# Patient Record
Sex: Male | Born: 1960 | Race: White | Hispanic: No | Smoking: Former smoker
Health system: Southern US, Community
[De-identification: ages and names within clinical notes are randomized; demographics above are authoritative.]

## PROBLEM LIST (undated history)

## (undated) DIAGNOSIS — M1990 Unspecified osteoarthritis, unspecified site: Secondary | ICD-10-CM

## (undated) DIAGNOSIS — K219 Gastro-esophageal reflux disease without esophagitis: Secondary | ICD-10-CM

## (undated) DIAGNOSIS — F1910 Other psychoactive substance abuse, uncomplicated: Secondary | ICD-10-CM

## (undated) HISTORY — DX: Other psychoactive substance abuse, uncomplicated: F19.10

## (undated) HISTORY — PX: HERNIA REPAIR: SHX51

## (undated) HISTORY — DX: Gastro-esophageal reflux disease without esophagitis: K21.9

## (undated) HISTORY — PX: SPINE SURGERY: SHX786

## (undated) HISTORY — DX: Unspecified osteoarthritis, unspecified site: M19.90

## (undated) HISTORY — PX: FRACTURE SURGERY: SHX138

---

## 2021-08-26 ENCOUNTER — Other Ambulatory Visit: Payer: Self-pay

## 2021-08-26 ENCOUNTER — Ambulatory Visit: Payer: BC Managed Care – PPO | Admitting: Family

## 2021-08-26 ENCOUNTER — Encounter: Payer: Self-pay | Admitting: Family

## 2021-08-26 VITALS — BP 136/82 | HR 72 | Temp 97.8°F | Ht 70.0 in | Wt 185.6 lb

## 2021-08-26 DIAGNOSIS — R202 Paresthesia of skin: Secondary | ICD-10-CM

## 2021-08-26 DIAGNOSIS — D1779 Benign lipomatous neoplasm of other sites: Secondary | ICD-10-CM

## 2021-08-26 DIAGNOSIS — Z8042 Family history of malignant neoplasm of prostate: Secondary | ICD-10-CM

## 2021-08-26 DIAGNOSIS — Z23 Encounter for immunization: Secondary | ICD-10-CM | POA: Diagnosis not present

## 2021-08-26 DIAGNOSIS — Z0000 Encounter for general adult medical examination without abnormal findings: Secondary | ICD-10-CM | POA: Diagnosis not present

## 2021-08-26 DIAGNOSIS — L918 Other hypertrophic disorders of the skin: Secondary | ICD-10-CM

## 2021-08-26 DIAGNOSIS — N529 Male erectile dysfunction, unspecified: Secondary | ICD-10-CM

## 2021-08-26 DIAGNOSIS — R200 Anesthesia of skin: Secondary | ICD-10-CM

## 2021-08-26 NOTE — Patient Instructions (Addendum)
Welcome to [facility]! It was a pleasure meeting you today.  Go to the lab for blood work today. As discussed, I have sent your referrals for Orthopedics, Gastroenterology (Colonoscopy) and Dermatology, they will call you directly.  Please schedule a follow up visit today to discuss your ADHD.    PLEASE NOTE:  If you had any LAB tests please let us know if you have not heard back within a few days. You may see your results on MyChart before we have a chance to review them but we will give you a call once they are reviewed by [place]. If we ordered any REFERRALS today, please let us know if you have not heard from their office within the next week.  Let us know through MyChart if you are needing REFILLS, or have your pharmacy send [place] the request. You can also use MyChart to communicate with me or any office staff.  Please try these tips to maintain a healthy lifestyle:  Eat most of your calories during the day when you are active. Eliminate processed foods including packaged sweets (pies, cakes, cookies), reduce intake of potatoes, white bread, white pasta, and white rice. Look for whole grain options, oat flour or almond flour.  Each meal should contain half fruits/vegetables, one quarter protein, and one quarter carbs (no bigger than a computer mouse).  Cut down on sweet beverages. This includes juice, soda, and sweet tea. Also watch fruit intake, though this is a healthier sweet option, it still contains natural sugar! Limit to 3 servings daily.  Drink at least 1 glass of water with each meal and aim for at least 8 glasses per day  Exercise at least 150 minutes every week.

## 2021-08-26 NOTE — Progress Notes (Signed)
Phone: [phone]   Subjective:  Patient 61 y.o. male presenting for annual physical.  Chief Complaint  Patient presents with   Annual Exam    W/labs-ate a piece of fruit around lunchtime.    Skin Tag    Pt noticed places on his back that has increased in size in the last year. He also states that they are itchy.    Tumor    He states that he has an enlarged "fatty" tumor he would like to discuss.    HPI: Erectile Dysfunction: Patient complains of erectile dysfunction.  Onset of dysfunction was  months ago and was gradual in onset.  Patient states the nature of difficulty is maintaining erection. Full erections occur never. Partial erections occur with intercourse. Libido is not affected. 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 Patient denies history of cardiovascular disease, diabetes mellitus, neurologic disease, and urologic disease. Patient's expectations as to sexual function are to have a full erection during intercourse.  Patient's description of relationship w/partner ok.  Previous treatment of ED includes none.  Neuropathy: He describes symptoms of numbness and tingling. Onset of symptoms was  years ago . Symptoms are currently of localized severity. Symptoms occur intermittently and last minutes-hour. The patient denies burning and lancinating pain. Symptoms are worse on the right side. He also describes autonomic symptoms of  none.  Previous treatment has included Gabapentin, which did not have an impact on symptoms.   See problem oriented charting- ROS- full  review of systems was completed and negative  except for: right arm neuropathy and ED noted in HPI above.  The following were reviewed and entered/updated in epic: Past Medical History:  Diagnosis Date   Arthritis    GERD (gastroesophageal reflux disease)    Substance abuse ([place]) 1981   Patient Active Problem List   Diagnosis Date Noted   Lipoma 08/29/2021   History of cervical spinal surgery 08/29/2021    Numbness and tingling in right hand 08/29/2021   Erectile dysfunction 08/26/2021   Past Surgical History:  Procedure Laterality Date   FRACTURE SURGERY  02/1981   4th metacarpal Left hand   [facility]?   Rt side   SPINE SURGERY  09/2001   Neck surgery C4/C5    Family History  Problem Relation Age of Onset   Depression Mother    Diabetes Mother    ADD / ADHD Son    Depression Sister    Depression Sister     Medications- reviewed and updated Current Outpatient Medications  Medication Sig Dispense Refill   acetaminophen (TYLENOL) 500 MG tablet Take 500 mg by mouth every 6 (six) hours as needed.     famotidine (PEPCID) 20 MG tablet 20 mg  prn as needed     ibuprofen (ADVIL) 200 MG tablet 200 mg  prn as needed     No current facility-administered medications for this visit.    Allergies-reviewed and updated No Known Allergies  Social History   Social History Narrative   Not on file   Objective  Objective:  BP 136/82    Pulse 72    Temp 97.8 F (36.6 C) (Temporal)    Ht 5\' 10"  (1.778 m) Comment: with shoes   Wt 185 lb 9.6 oz (84.2 kg)    SpO2 98%    BMI 26.63 kg/m  Gen: NAD, resting comfortably HEENT: Mucous membranes are moist. Oropharynx normal Neck: no thyromegaly CV: RRR no murmurs rubs or gallops Lungs: CTAB no crackles, wheeze,  rhonchi Abdomen: soft/nontender/nondistended/normal bowel sounds. No rebound or guarding.  Ext: no edema Skin: warm, dry Neuro: grossly normal, moves all extremities, PERRLA    Assessment and Plan   Health Maintenance counseling: 1. Anticipatory guidance: Patient counseled regarding regular dental exams q6 months, eye exams yearly, avoiding smoking and second hand smoke, limiting alcohol to 2 beverages per day.   2. Risk factor reduction:  Advised patient of need for regular exercise and diet rich in fruits and vegetables to reduce risk of heart attack and stroke.    Wt Readings from Last 3 Encounters:  08/26/21 185  lb 9.6 oz (84.2 kg)   3. Immunizations/screenings/ancillary studies Immunization History  Administered Date(s) Administered   Influenza,inj,Quad PF,6+ Mos 08/26/2021   Health Maintenance Due  Topic Date Due   HIV Screening  Never done   Hepatitis C Screening  Never done   Zoster Vaccines- Shingrix (1 of 2) Never done   COLONOSCOPY (Pts 45-31yrs Insurance coverage will need to be confirmed)  Never done    4. Prostate cancer screening >55yo - risk factors?  Lab Results  Component Value Date   PSA 2.59 08/26/2021    5. Colon cancer screening:  yes, not due 6. Skin cancer screening- advised regular sunscreen use. Denies worrisome, changing, or new skin lesions.  7. Smoking associated screening (lung cancer screening, AAA screen 65-75, UA)- non- smoker 8. STD screening - N/A  Problem List Items Addressed This Visit       Other   Erectile dysfunction    New - checking testosterone level today      Relevant Orders   Testosterone, Free, Total, SHBG   Lipoma    middle of chest, skin colored, non-tender, reassured pt benign and will continue to monitor.      Numbness and tingling in right hand    HX of cervical sgy C4-C5 - then developed herniated C6 but told sgy not recommended. Used to take Gabapentin but states he is not currently.       Relevant Orders   Ambulatory referral to Orthopedic Surgery   Other Visit Diagnoses     Annual physical exam    -  Primary   Relevant Orders   Ambulatory referral to Gastroenterology   CBC with Differential/Platelet (Completed)   Comprehensive metabolic panel (Completed)   Lipid panel (Completed)   TSH (Completed)   Need for immunization against influenza       Relevant Orders   Flu Vaccine QUAD 27mo+IM (Fluarix, Fluzone & Alfiuria Quad PF) (Completed)   Multiple acquired skin tags       Relevant Orders   Ambulatory referral to Dermatology   Family history of prostate cancer in father       Relevant Orders   PSA (Completed)        Recommended follow up: Return in about 1 week (around 09/02/2021) for f/u appt for ADHD. Future Appointments  Date Time Provider [place]  09/02/2021  1:30 PM Jeanie Sewer, NP [facility]     Lab/Order associations: non- fasting   ICD-10-CM   1. Annual physical exam  Z00.00 Ambulatory referral to Gastroenterology    CBC with Differential/Platelet    Comprehensive metabolic panel    Lipid panel    TSH    CANCELED: Comprehensive metabolic panel    CANCELED: TSH    CANCELED: Lipid panel    CANCELED: CBC with Differential/Platelet    2. Need for immunization against influenza  Z23 Flu Vaccine QUAD 62mo+IM (Fluarix, Fluzone &  Alfiuria Quad PF)    3. Lipoma of other specified sites  D17.79     4. Multiple acquired skin tags  L91.8 Ambulatory referral to Dermatology    5. Numbness and tingling in right hand  R20.0 Ambulatory referral to Orthopedic Surgery   R20.2     6. Family history of prostate cancer in father  Z9.42 PSA    CANCELED: PSA    7. Erectile dysfunction, unspecified erectile dysfunction type  N52.9 Testosterone, Free, Total, SHBG    CANCELED: Testosterone, Free, Total, SHBG        Gaynor Ferreras, [place], NP

## 2021-08-27 LAB — CBC WITH DIFFERENTIAL/PLATELET
Absolute Monocytes: 608 cells/uL (ref 200–950)
Basophils Absolute: 53 cells/uL (ref 0–200)
Basophils Relative: 0.7 %
Eosinophils Absolute: 84 cells/uL (ref 15–500)
Eosinophils Relative: 1.1 %
HCT: 48 % (ref 38.5–50.0)
Hemoglobin: 16.5 g/dL (ref 13.2–17.1)
Lymphs Abs: 1588 cells/uL (ref 850–3900)
MCH: 30.5 pg (ref 27.0–33.0)
MCHC: 34.4 g/dL (ref 32.0–36.0)
MCV: 88.7 fL (ref 80.0–100.0)
MPV: 11.5 fL (ref 7.5–12.5)
Monocytes Relative: 8 %
Neutro Abs: 5267 cells/uL (ref 1500–7800)
Neutrophils Relative %: 69.3 %
Platelets: 187 10*3/uL (ref 140–400)
RBC: 5.41 10*6/uL (ref 4.20–5.80)
RDW: 12.2 % (ref 11.0–15.0)
Total Lymphocyte: 20.9 %
WBC: 7.6 10*3/uL (ref 3.8–10.8)

## 2021-08-27 LAB — COMPREHENSIVE METABOLIC PANEL
AG Ratio: 1.9 (calc) (ref 1.0–2.5)
ALT: 21 U/L (ref 9–46)
AST: 19 U/L (ref 10–35)
Albumin: 4.6 g/dL (ref 3.6–5.1)
Alkaline phosphatase (APISO): 53 U/L (ref 35–144)
BUN: 16 mg/dL (ref 7–25)
CO2: 22 mmol/L (ref 20–32)
Calcium: 9.6 mg/dL (ref 8.6–10.3)
Chloride: 106 mmol/L (ref 98–110)
Creat: 0.85 mg/dL (ref 0.70–1.35)
Globulin: 2.4 g/dL (calc) (ref 1.9–3.7)
Glucose, Bld: 96 mg/dL (ref 65–99)
Potassium: 4.2 mmol/L (ref 3.5–5.3)
Sodium: 141 mmol/L (ref 135–146)
Total Bilirubin: 0.7 mg/dL (ref 0.2–1.2)
Total Protein: 7 g/dL (ref 6.1–8.1)

## 2021-08-27 LAB — LIPID PANEL
Cholesterol: 163 mg/dL (ref <200)
HDL: 47 mg/dL (ref >=40)
LDL Cholesterol (Calc): 90 mg/dL (calc)
Non-HDL Cholesterol (Calc): 116 mg/dL (calc) (ref <130)
Total CHOL/HDL Ratio: 3.5 (calc) (ref <5.0)
Triglycerides: 154 mg/dL — ABNORMAL HIGH (ref <150)

## 2021-08-27 LAB — TSH: TSH: 1.76 mIU/L (ref 0.40–4.50)

## 2021-08-27 LAB — PSA: PSA: 2.59 ng/mL (ref <=4.00)

## 2021-08-29 DIAGNOSIS — R200 Anesthesia of skin: Secondary | ICD-10-CM | POA: Insufficient documentation

## 2021-08-29 DIAGNOSIS — D179 Benign lipomatous neoplasm, unspecified: Secondary | ICD-10-CM | POA: Insufficient documentation

## 2021-08-29 DIAGNOSIS — Z9889 Other specified postprocedural states: Secondary | ICD-10-CM | POA: Insufficient documentation

## 2021-08-29 HISTORY — DX: Anesthesia of skin: R20.0

## 2021-08-29 LAB — SPECIMEN STATUS REPORT

## 2021-08-29 NOTE — Assessment & Plan Note (Signed)
middle of chest, skin colored, non-tender, reassured pt benign and will continue to monitor.

## 2021-08-29 NOTE — Assessment & Plan Note (Signed)
HX of cervical sgy C4-C5 - then developed herniated C6 but told sgy not recommended. Used to take Gabapentin but states he is not currently.

## 2021-08-29 NOTE — Assessment & Plan Note (Signed)
New - checking testosterone level today

## 2021-09-02 ENCOUNTER — Ambulatory Visit: Payer: BC Managed Care – PPO | Admitting: Family

## 2021-09-02 ENCOUNTER — Encounter: Payer: Self-pay | Admitting: Family

## 2021-09-02 ENCOUNTER — Other Ambulatory Visit: Payer: Self-pay

## 2021-09-02 VITALS — BP 123/76 | HR 55 | Temp 97.7°F | Ht 70.0 in | Wt 188.8 lb

## 2021-09-02 DIAGNOSIS — F5232 Male orgasmic disorder: Secondary | ICD-10-CM

## 2021-09-02 DIAGNOSIS — N529 Male erectile dysfunction, unspecified: Secondary | ICD-10-CM | POA: Diagnosis not present

## 2021-09-02 DIAGNOSIS — F900 Attention-deficit hyperactivity disorder, predominantly inattentive type: Secondary | ICD-10-CM | POA: Insufficient documentation

## 2021-09-02 MED ORDER — BUPROPION HCL ER (SR) 150 MG PO TB12: ORAL_TABLET | ORAL | 1 refills | Status: DC

## 2021-09-02 MED ORDER — BUPROPION HCL ER (SR) 150 MG PO TB12: ORAL_TABLET | ORAL | 2 refills | Status: DC

## 2021-09-02 NOTE — Assessment & Plan Note (Signed)
testosterone level wnl. pt reports that he can initiate an erection and maintain an erection sometimes, but will not orgasm. However, when he masturbates he can orgasm. Restarting Wellbutrin today for ADHD and he may see improvement in this regard as well. Discussed sex therapy as an option also. Advised to let me know if continuing concerns in a 2-3 weeks.

## 2021-09-02 NOTE — Assessment & Plan Note (Signed)
f/u today - testosterone level wnl. pt reports that he can initiate an erection and maintain an erection sometimes, but will not orgasm. However, when he masturbates he can orgasm. Restarting Wellbutrin today for ADHD and he may see improvement in this regard as well. Advised to let me know if continuing concerns in a 2-3 weeks.

## 2021-09-02 NOTE — Assessment & Plan Note (Signed)
Chronic - pt has had for long time, but due to no insurance and other concerns, has not taken his Wellbutrin in about 6 years, would like to restart this today. Advised on use and SE, will start 150mg  bid, f/u in 6 mos or let me know sooner if any concerns.

## 2021-09-02 NOTE — Patient Instructions (Addendum)
It was very nice to see you today!  Wellbutrin refill has been sent to your pharmacy. Start this daily in the morning for one week before increasing to twice a day to avoid side effects. Let me know if you have any or as discussed, it is not helping with your ED or lack of orgasm after taking for 2-3 weeks.    PLEASE NOTE:  If you had any lab tests please let us know if you have not heard back within a few days. You may see your results on MyChart before we have a chance to review them but we will give you a call once they are reviewed by [place]. If we ordered any referrals today, please let us know if you have not heard from their office within the next week.

## 2021-09-02 NOTE — Progress Notes (Signed)
Subjective:     Patient ID: Karl Davis, male    DOB: 1961-02-27, 61 y.o.   MRN: [id number]  Chief Complaint  Patient presents with   ADD    Pt is requesting Wellbutrin refill.   HPI: ADHD f/u: Medications helping target goals: Wellbutrin Regimen: bid Medication side effects/concerns: none Weight: stable Sleep: not affected Mood changes: none Tics: denies Blood pressure, Weight, Pulse, Behavior reviewed: wnl Erectile Dysfunction: Patient complains of erectile dysfunction.  Onset of dysfunction was months ago and was gradual in onset.  Patient states the nature of difficulty is sometimes maintaining erection. Full erections occur sometimes. Partial erections occur with intercourse. Pt also reports anorgasmy with intercourse but can have orgasm with masturbation. Libido is not affected. 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 Patient denies history of cardiovascular disease, diabetes mellitus, neurologic disease, and urologic disease. Patient's expectations as to sexual function are to have a full erection during intercourse.  Patient's description of relationship w/partner ok.  Previous treatment of ED includes none.      Health Maintenance Due  Topic Date Due   HIV Screening  Never done   Hepatitis C Screening  Never done   Zoster Vaccines- Shingrix (1 of 2) Never done   COLONOSCOPY (Pts 45-4yrs Insurance coverage will need to be confirmed)  Never done    Past Medical History:  Diagnosis Date   Arthritis    GERD (gastroesophageal reflux disease)    Substance abuse ([place]) 1981    Past Surgical History:  Procedure Laterality Date   FRACTURE SURGERY  02/1981   4th metacarpal Left hand   HERNIA REPAIR  1997? 1998?   Rt side   SPINE SURGERY  09/2001   Neck surgery C4/C5    Outpatient Medications Prior to Visit  Medication Sig Dispense Refill   acetaminophen (TYLENOL) 500 MG tablet Take 500 mg by mouth every 6 (six) hours as needed.     famotidine (PEPCID)  20 MG tablet 20 mg  prn as needed     ibuprofen (ADVIL) 200 MG tablet 200 mg  prn as needed     No facility-administered medications prior to visit.    No Known Allergies      Objective:    Physical Exam Vitals and nursing note reviewed.  Constitutional:      General: He is not in acute distress.    Appearance: Normal appearance.  HENT:     Head: Normocephalic.  Cardiovascular:     Rate and Rhythm: Normal rate and regular rhythm.  Pulmonary:     Effort: Pulmonary effort is normal.     Breath sounds: Normal breath sounds.  Musculoskeletal:        General: Normal range of motion.     Cervical back: Normal range of motion.  Skin:    General: Skin is warm and dry.  Neurological:     Mental Status: He is alert and oriented to person, place, and time.  Psychiatric:        Mood and Affect: Mood normal.    BP 123/76    Pulse (!) 55    Temp 97.7 F (36.5 C) (Temporal)    Ht 5\' 10"  (1.778 m)    Wt 188 lb 12.8 oz (85.6 kg)    SpO2 98%    BMI 27.09 kg/m  Wt Readings from Last 3 Encounters:  09/02/21 188 lb 12.8 oz (85.6 kg)  08/26/21 185 lb 9.6 oz (84.2 kg)       Assessment &  Plan:   Problem List Items Addressed This Visit       Other   Erectile dysfunction    f/u today - testosterone level wnl. pt reports that he can initiate an erection and maintain an erection sometimes, but will not orgasm. However, when he masturbates he can orgasm. Restarting Wellbutrin today for ADHD and he may see improvement in this regard as well. Advised to let me know if continuing concerns in a 2-3 weeks.      Attention deficit hyperactivity disorder (ADHD), predominantly inattentive type - Primary    Chronic - pt has had for long time, but due to no insurance and other concerns, has not taken his Wellbutrin in about 6 years, would like to restart this today. Advised on use and SE, will start 150mg  bid, f/u in 6 mos or let me know sooner if any concerns.      Relevant Medications    buPROPion (WELLBUTRIN SR) 150 MG 12 hr tablet   Anorgasmia of male    testosterone level wnl. pt reports that he can initiate an erection and maintain an erection sometimes, but will not orgasm. However, when he masturbates he can orgasm. Restarting Wellbutrin today for ADHD and he may see improvement in this regard as well. Discussed sex therapy as an option also. Advised to let me know if continuing concerns in a 2-3 weeks.       Meds ordered this encounter  Medications   DISCONTD: buPROPion (WELLBUTRIN SR) 150 MG 12 hr tablet    Sig: Take one tablet by mouth daily for 3 days, and then increase to one tablet by mouth twice daily.    Dispense:  60 tablet    Refill:  2    Order Specific Question:   Supervising Provider    Answer:   ANDY, CAMILLE L [[id number]]   buPROPion (WELLBUTRIN SR) 150 MG 12 hr tablet    Sig: Take one tablet by mouth daily for 3 days, and then increase to one tablet by mouth twice daily.    Dispense:  180 tablet    Refill:  1    Order Specific Question:   Supervising Provider    Answer:   ANDY, CAMILLE L [[id number]]

## 2021-09-06 ENCOUNTER — Ambulatory Visit: Payer: Self-pay

## 2021-09-06 ENCOUNTER — Encounter: Payer: Self-pay | Admitting: Orthopaedic Surgery

## 2021-09-06 ENCOUNTER — Other Ambulatory Visit: Payer: Self-pay

## 2021-09-06 ENCOUNTER — Ambulatory Visit: Payer: BC Managed Care – PPO | Admitting: Orthopaedic Surgery

## 2021-09-06 VITALS — BP 133/86 | HR 58 | Ht 70.0 in | Wt 185.0 lb

## 2021-09-06 DIAGNOSIS — R200 Anesthesia of skin: Secondary | ICD-10-CM

## 2021-09-06 DIAGNOSIS — M542 Cervicalgia: Secondary | ICD-10-CM | POA: Diagnosis not present

## 2021-09-06 LAB — TESTOSTERONE, FREE, TOTAL, SHBG
Sex Hormone Binding: 4.7 nmol/L — ABNORMAL LOW (ref 19.3–76.4)
Testosterone, Free: 10.1 pg/mL (ref 6.6–18.1)
Testosterone: 452 ng/dL (ref 264–916)

## 2021-09-06 NOTE — Progress Notes (Signed)
Office Visit Note   Patient: Karl Davis           Date of Birth: 1961-03-22           MRN: [id number] Visit Date: 09/06/2021              Requested by: Jeanie Sewer, NP [place],  [place] [address] PCP: Jeanie Sewer, NP   Assessment & Plan: Visit Diagnoses:  1. Neck pain   2. 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  Single level C5-6 fused by Dr. Huston Foley 20 years ago is solid.  Will obtain some nerve conduction velocities evaluating for carpal tunnel syndrome versus cervical radiculopathy office follow-up after tests for review.  Follow-Up Instructions: No follow-ups on file.   Orders:  Orders Placed This Encounter  Procedures   XR Cervical Spine 2 or 3 views   Ambulatory referral to Physical Medicine Rehab   No orders of the defined types were placed in this encounter.     Procedures: No procedures performed   Clinical Data: No additional findings.   Subjective: Chief Complaint  Patient presents with   Neck - Pain    HPI 61 year old male had previous cervical fusion 20 years ago by Dr. Jackolyn Confer at [facility] at C5-6 which is solid.  Patient previously gone to the pain clinic for injections with relief he has been on tramadol and gabapentin.  He has had numbness in his right hand and fingers some on the radial side occasionally ulnar.  He has noticed some right arm weakness but is able to do a push-up today in the office.  Occasional symptoms on the left but primarily right.  He is used ibuprofen and Tylenol with relief.  Review of Systems past history of ADHD previous cervical fusion.   Objective: Vital Signs: BP 133/86    Pulse (!) 58    Ht 5\' 10"  (1.778 m)    Wt 185 lb (83.9 kg)    BMI 26.54 kg/m   Physical Exam Constitutional:      Appearance: He is well-developed.  HENT:     Head:  Normocephalic and atraumatic.     Right Ear: External ear normal.     Left Ear: External ear normal.  Eyes:     Pupils: Pupils are equal, round, and reactive to light.  Neck:     Thyroid: No thyromegaly.     Trachea: No tracheal deviation.  Cardiovascular:     Rate and Rhythm: Normal rate.  Pulmonary:     Effort: Pulmonary effort is normal.     Breath sounds: No wheezing.  Abdominal:     General: Bowel sounds are normal.     Palpations: Abdomen is soft.  Musculoskeletal:     Cervical back: Neck supple.  Skin:    General: Skin is warm and dry.     Capillary Refill: Capillary refill takes less than 2 seconds.  Neurological:     Mental Status: He is alert and oriented to person, place, and time.  Psychiatric:        Behavior: Behavior normal.        Thought Content: Thought content normal.        Judgment: Judgment normal.    Ortho Exam reflexes are 2+ no thenar atrophy some discomfort with Phalen's test positive carpal compression test on the right some  brachial plexus tenderness.  He has 70% rotation of the right and 40% to the left with discomfort.  Specialty Comments:  No specialty comments available.  Imaging: XR Cervical Spine 2 or 3 views  Result Date: 09/06/2021 AP lateral cervical spine images are obtained and reviewed.  This shows previous C5-6 fusion with bridging spurs C2-C7 with partial consolidation.  Disc base remains open except for C5-6 that is solidly healed. Assessment: Hypertrophic spondylosis with prominent anterior osteophytes.  Solid C5-6 fusion.    PMFS History: Patient Active Problem List   Diagnosis Date Noted   Attention deficit hyperactivity disorder (ADHD), predominantly inattentive type 09/02/2021   Anorgasmia of male 09/02/2021   Lipoma 08/29/2021   History of cervical spinal surgery 08/29/2021   Numbness and tingling in right hand 08/29/2021   Erectile dysfunction 08/26/2021   Past Medical History:  Diagnosis Date   Arthritis    GERD  (gastroesophageal reflux disease)    Substance abuse ([place]) 1981    Family History  Problem Relation Age of Onset   Depression Mother    Diabetes Mother    ADD / ADHD Son    Depression Sister    Depression Sister     Past Surgical History:  Procedure Laterality Date   FRACTURE SURGERY  02/1981   4th metacarpal Left hand   HERNIA REPAIR  1997? 1998?   Rt side   SPINE SURGERY  09/2001   Neck surgery C4/C5   Social History   Occupational History   Not on file  Tobacco Use   Smoking status: Never    Passive exposure: Yes   Smokeless tobacco: Former  Substance and Sexual Activity   Alcohol use: Not Currently   Drug use: Not Currently    Types: Amphetamines, "Crack" cocaine, Marijuana, LSD   Sexual activity: Yes

## 2021-09-12 ENCOUNTER — Encounter: Payer: Self-pay | Admitting: Family

## 2021-09-13 NOTE — Telephone Encounter (Signed)
Please see message and advise. Any recommendations?  ?

## 2021-09-15 ENCOUNTER — Other Ambulatory Visit: Payer: Self-pay

## 2021-09-27 ENCOUNTER — Encounter: Payer: Self-pay | Admitting: Physical Medicine and Rehabilitation

## 2021-09-27 ENCOUNTER — Other Ambulatory Visit: Payer: Self-pay

## 2021-09-27 ENCOUNTER — Ambulatory Visit (INDEPENDENT_AMBULATORY_CARE_PROVIDER_SITE_OTHER): Payer: BC Managed Care – PPO | Admitting: Physical Medicine and Rehabilitation

## 2021-09-27 DIAGNOSIS — R202 Paresthesia of skin: Secondary | ICD-10-CM | POA: Diagnosis not present

## 2021-09-27 NOTE — Procedures (Signed)
EMG & NCV Findings: ?Evaluation of the right median motor nerve showed prolonged distal onset latency (4.5 ms) and decreased conduction velocity (Elbow-Wrist, 46 m/s).  The left median (across palm) sensory and the right median (across palm) sensory nerves showed prolonged distal peak latency (Wrist, L4.2, R4.5 ms).  All remaining nerves (as indicated in the following tables) were within normal limits.  All left vs. right side differences were within normal limits.   ? ?All examined muscles (as indicated in the following table) showed no evidence of electrical instability.   ? ?Impression: ?The above electrodiagnostic study is ABNORMAL and reveals evidence of: ? ?a moderate left median nerve entrapment at the wrist (carpal tunnel syndrome) affecting sensory and motor components. ?  ?a mild left median nerve entrapment at the wrist (carpal tunnel syndrome) affecting sensory components.  ? ?There is no significant electrodiagnostic evidence of any other focal nerve entrapment, brachial plexopathy or cervical radiculopathy. ** As you know, this particular electrodiagnostic study cannot rule out chemical radiculitis or sensory only radiculopathy. ? ?Recommendations: ?1.  Follow-up with referring physician. ?2.  Continue current management of symptoms. ?3.  Continue use of resting splint at night-time and as needed during the day. Surgical eval. ? ?___________________________ ?Laurence Spates FAAPMR ?Board Certified, Tax adviser of Physical Medicine and Rehabilitation ? ? ? ?Nerve Conduction Studies ?Anti Sensory Summary Table ? ? Stim Site NR Peak (ms) Norm Peak (ms) P-T Amp (?V) Norm P-T Amp Site1 Site2 Delta-P (ms) Dist (cm) Vel (m/s) Norm Vel (m/s)  ?Left Median Acr Palm Anti Sensory (2nd Digit)  30.9?C  ?Wrist    *4.2 <3.6 15.7 >10 Wrist Palm 2.4 0.0    ?Palm    1.8 <2.0 18.5         ?Right Median Acr Palm Anti Sensory (2nd Digit)  30.4?C  ?Wrist    *4.5 <3.6 20.5 >10 Wrist Palm 2.5 0.0    ?Palm    2.0 <2.0 17.0          ?Left Radial Anti Sensory (Base 1st Digit)  30.9?C  ?Wrist    2.2 <3.1 34.6  Wrist Base 1st Digit 2.2 0.0    ?Right Radial Anti Sensory (Base 1st Digit)  30.6?C  ?Wrist    2.3 <3.1 31.6  Wrist Base 1st Digit 2.3 0.0    ?Left Ulnar Anti Sensory (5th Digit)  31.1?C  ?Wrist    3.4 <3.7 29.2 >15.0 Wrist 5th Digit 3.4 14.0 41 >38  ?Right Ulnar Anti Sensory (5th Digit)  30.7?C  ?Wrist    3.3 <3.7 18.8 >15.0 Wrist 5th Digit 3.3 14.0 42 >38  ? ?Motor Summary Table ? ? Stim Site NR Onset (ms) Norm Onset (ms) O-P Amp (mV) Norm O-P Amp Site1 Site2 Delta-0 (ms) Dist (cm) Vel (m/s) Norm Vel (m/s)  ?Left Median Motor (Abd Poll Brev)  31?C  ?Wrist    3.9 <4.2 7.7 >5 Elbow Wrist 4.1 20.5 50 >50  ?Elbow    8.0  7.5         ?Right Median Motor (Abd Poll Brev)  30.9?C  ?Wrist    *4.5 <4.2 5.5 >5 Elbow Wrist 4.5 20.5 *46 >50  ?Elbow    9.0  5.9         ?Left Ulnar Motor (Abd Dig Min)  31.2?C  ?Wrist    3.2 <4.2 10.1 >3 B Elbow Wrist 3.4 21.0 62 >53  ?B Elbow    6.6  8.2  A Elbow B Elbow 1.6 10.0 63 >53  ?  A Elbow    8.2  7.9         ?Right Ulnar Motor (Abd Dig Min)  31.1?C  ?Wrist    3.0 <4.2 12.0 >3 B Elbow Wrist 3.6 21.0 58 >53  ?B Elbow    6.6  11.0  A Elbow B Elbow 1.5 10.5 70 >53  ?A Elbow    8.1  10.6         ? ?EMG ? ? Side Muscle Nerve Root Ins Act Fibs Psw Amp Dur Poly Recrt Int Fraser Din Comment  ?Right Abd Poll Brev Median C8-T1 Nml Nml Nml Nml Nml 0 Nml Nml   ?Right 1stDorInt Ulnar C8-T1 Nml Nml Nml Nml Nml 0 Nml Nml   ?Right PronatorTeres Median C6-7 Nml Nml Nml Nml Nml 0 Nml Nml   ?Right Biceps Musculocut C5-6 Nml Nml Nml Nml Nml 0 Nml Nml   ?Right Deltoid Axillary C5-6 Nml Nml Nml Nml Nml 0 Nml Nml   ? ? ?Nerve Conduction Studies ?Anti Sensory Left/Right Comparison ? ? Stim Site L Lat (ms) R Lat (ms) L-R Lat (ms) L Amp (?V) R Amp (?V) L-R Amp (%) Site1 Site2 L Vel (m/s) R Vel (m/s) L-R Vel (m/s)  ?Median Acr Palm Anti Sensory (2nd Digit)  30.9?C  ?Wrist *4.2 *4.5 0.3 15.7 20.5 23.4 Wrist Palm     ?Palm 1.8 2.0 0.2 18.5 17.0  8.1       ?Radial Anti Sensory (Base 1st Digit)  30.9?C  ?Wrist 2.2 2.3 0.1 34.6 31.6 8.7 Wrist Base 1st Digit     ?Ulnar Anti Sensory (5th Digit)  31.1?C  ?Wrist 3.4 3.3 0.1 29.2 18.8 35.6 Wrist 5th Digit 41 42 1  ? ?Motor Left/Right Comparison ? ? Stim Site L Lat (ms) R Lat (ms) L-R Lat (ms) L Amp (mV) R Amp (mV) L-R Amp (%) Site1 Site2 L Vel (m/s) R Vel (m/s) L-R Vel (m/s)  ?Median Motor (Abd Poll Brev)  31?C  ?Wrist 3.9 *4.5 0.6 7.7 5.5 28.6 Elbow Wrist 50 *46 4  ?Elbow 8.0 9.0 1.0 7.5 5.9 21.3       ?Ulnar Motor (Abd Dig Min)  31.2?C  ?Wrist 3.2 3.0 0.2 10.1 12.0 15.8 B Elbow Wrist 62 58 4  ?B Elbow 6.6 6.6 0.0 8.2 11.0 25.5 A Elbow B Elbow 63 70 7  ?A Elbow 8.2 8.1 0.1 7.9 10.6 25.5       ? ? ? ?Waveforms: ?    ? ?    ? ?    ? ?  ? ?

## 2021-09-27 NOTE — Progress Notes (Addendum)
? ?Karl Davis - 61 y.o. male MRN [id number]  Date of birth: 21-Feb-1961 ? ?Office Visit Note: ?Visit Date: 09/27/2021 ?PCP: Jeanie Sewer, NP ?Referred by: Marybelle Killings, MD ? ?Subjective: ?Chief Complaint  ?Patient presents with  ? Right Hand - Numbness  ? Left Hand - Numbness  ? ?HPI:  Karl Davis is a 61 y.o. male who comes in today at the request of Dr. Rodell Perna for electrodiagnostic study of the Bilateral upper extremities.  Patient is Right hand dominant.  He reports symptoms in both hands right more than left.  He endorses more ulnar-sided symptoms than radial but sometimes it can be the whole hand.  He has a history of prior C5-6 cervical fusion some 20 years ago.  He does have some neck pain and shoulder pain at times.  He has not had previous electrodiagnostic studies.  He denies any diabetes. ? ?ROS Otherwise per HPI. ? ?Assessment & Plan: ?Visit Diagnoses:  ?  ICD-10-CM   ?1. Paresthesia of skin  R20.2 NCV with EMG (electromyography)  ?  ?  ?Plan: Impression: ?The above electrodiagnostic study is ABNORMAL and reveals evidence of: ? ?a moderate RIGHT median nerve entrapment at the wrist (carpal tunnel syndrome) affecting sensory and motor components. ?  ?a mild left median nerve entrapment at the wrist (carpal tunnel syndrome) affecting sensory components.  ? ?There is no significant electrodiagnostic evidence of any other focal nerve entrapment, brachial plexopathy or cervical radiculopathy. ** As you know, this particular electrodiagnostic study cannot rule out chemical radiculitis or sensory only radiculopathy. ? ?Recommendations: ?1.  Follow-up with referring physician. ?2.  Continue current management of symptoms. ?3.  Continue use of resting splint at night-time and as needed during the day. Surgical eval. ? ?Meds & Orders: No orders of the defined types were placed in this encounter. ?  ?Orders Placed This Encounter  ?Procedures  ? NCV with EMG (electromyography)  ?   ?Follow-up: Return in about 2 weeks (around 10/11/2021) for Rodell Perna, MD.  ? 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 ?EMG & NCV Findings: ?Evaluation of the right median motor nerve showed prolonged distal onset latency (4.5 ms) and decreased conduction velocity (Elbow-Wrist, 46 m/s).  The left median (across palm) sensory and the right median (across palm) sensory nerves showed prolonged distal peak latency (Wrist, L4.2, R4.5 ms).  All remaining nerves (as indicated in the following tables) were within normal limits.  All left vs. right side differences were within normal limits.   ? ?All examined muscles (as indicated in the following table) showed no evidence of electrical instability.   ? ?Impression: ?The above electrodiagnostic study is ABNORMAL and reveals evidence of: ? ?a moderate left median nerve entrapment at the wrist (carpal tunnel syndrome) affecting sensory and motor components. ?  ?a mild left median nerve entrapment at the wrist (carpal tunnel syndrome) affecting sensory components.  ? ?There is no significant electrodiagnostic evidence of any other focal nerve entrapment, brachial plexopathy or cervical radiculopathy. ** As you know, this particular electrodiagnostic study cannot rule out chemical radiculitis or sensory only radiculopathy. ? ?Recommendations: ?1.  Follow-up with referring physician. ?2.  Continue current management of symptoms. ?3.  Continue use of resting splint at night-time and as needed during the day. Surgical eval. ? ?___________________________ ?Laurence Spates FAAPMR ?Board Certified, Tax adviser of Physical Medicine and Rehabilitation ? ? ? ?Nerve  Conduction Studies ?Anti Sensory Summary Table ? ? Stim Site NR Peak (ms) Norm Peak (ms) P-T Amp (?V) Norm P-T Amp Site1 Site2 Delta-P (ms) Dist (cm) Vel (m/s) Norm Vel (m/s)  ?Left Median Acr Palm Anti Sensory (2nd  Digit)  30.9?C  ?Wrist    *4.2 <3.6 15.7 >10 Wrist Palm 2.4 0.0    ?Palm    1.8 <2.0 18.5         ?Right Median Acr Palm Anti Sensory (2nd Digit)  30.4?C  ?Wrist    *4.5 <3.6 20.5 >10 Wrist Palm 2.5 0.0    ?Palm    2.0 <2.0 17.0         ?Left Radial Anti Sensory (Base 1st Digit)  30.9?C  ?Wrist    2.2 <3.1 34.6  Wrist Base 1st Digit 2.2 0.0    ?Right Radial Anti Sensory (Base 1st Digit)  30.6?C  ?Wrist    2.3 <3.1 31.6  Wrist Base 1st Digit 2.3 0.0    ?Left Ulnar Anti Sensory (5th Digit)  31.1?C  ?Wrist    3.4 <3.7 29.2 >15.0 Wrist 5th Digit 3.4 14.0 41 >38  ?Right Ulnar Anti Sensory (5th Digit)  30.7?C  ?Wrist    3.3 <3.7 18.8 >15.0 Wrist 5th Digit 3.3 14.0 42 >38  ? ?Motor Summary Table ? ? Stim Site NR Onset (ms) Norm Onset (ms) O-P Amp (mV) Norm O-P Amp Site1 Site2 Delta-0 (ms) Dist (cm) Vel (m/s) Norm Vel (m/s)  ?Left Median Motor (Abd Poll Brev)  31?C  ?Wrist    3.9 <4.2 7.7 >5 Elbow Wrist 4.1 20.5 50 >50  ?Elbow    8.0  7.5         ?Right Median Motor (Abd Poll Brev)  30.9?C  ?Wrist    *4.5 <4.2 5.5 >5 Elbow Wrist 4.5 20.5 *46 >50  ?Elbow    9.0  5.9         ?Left Ulnar Motor (Abd Dig Min)  31.2?C  ?Wrist    3.2 <4.2 10.1 >3 B Elbow Wrist 3.4 21.0 62 >53  ?B Elbow    6.6  8.2  A Elbow B Elbow 1.6 10.0 63 >53  ?A Elbow    8.2  7.9         ?Right Ulnar Motor (Abd Dig Min)  31.1?C  ?Wrist    3.0 <4.2 12.0 >3 B Elbow Wrist 3.6 21.0 58 >53  ?B Elbow    6.6  11.0  A Elbow B Elbow 1.5 10.5 70 >53  ?A Elbow    8.1  10.6         ? ?EMG ? ? Side Muscle Nerve Root Ins Act Fibs Psw Amp Dur Poly Recrt Int Fraser Din Comment  ?Right Abd Poll Brev Median C8-T1 Nml Nml Nml Nml Nml 0 Nml Nml   ?Right 1stDorInt Ulnar C8-T1 Nml Nml Nml Nml Nml 0 Nml Nml   ?Right PronatorTeres Median C6-7 Nml Nml Nml Nml Nml 0 Nml Nml   ?Right Biceps Musculocut C5-6 Nml Nml Nml Nml Nml 0 Nml Nml   ?Right Deltoid Axillary C5-6 Nml Nml Nml Nml Nml 0 Nml Nml   ? ? ?Nerve Conduction Studies ?Anti Sensory Left/Right Comparison ? ? Stim Site L Lat (ms) R Lat  (ms) L-R Lat (ms) L Amp (?V) R Amp (?V) L-R Amp (%) Site1 Site2 L Vel (m/s) R Vel (m/s) L-R Vel (m/s)  ?Median Acr Palm Anti Sensory (2nd Digit)  30.9?C  ?Wrist *4.2 *4.5 0.3 15.7  20.5 23.4 Wrist Palm     ?Palm 1.8 2.0 0.2 18.5 17.0 8.1       ?Radial Anti Sensory (Base 1st Digit)  30.9?C  ?Wrist 2.2 2.3 0.1 34.6 31.6 8.7 Wrist Base 1st Digit     ?Ulnar Anti Sensory (5th Digit)  31.1?C  ?Wrist 3.4 3.3 0.1 29.2 18.8 35.6 Wrist 5th Digit 41 42 1  ? ?Motor Left/Right Comparison ? ? Stim Site L Lat (ms) R Lat (ms) L-R Lat (ms) L Amp (mV) R Amp (mV) L-R Amp (%) Site1 Site2 L Vel (m/s) R Vel (m/s) L-R Vel (m/s)  ?Median Motor (Abd Poll Brev)  31?C  ?Wrist 3.9 *4.5 0.6 7.7 5.5 28.6 Elbow Wrist 50 *46 4  ?Elbow 8.0 9.0 1.0 7.5 5.9 21.3       ?Ulnar Motor (Abd Dig Min)  31.2?C  ?Wrist 3.2 3.0 0.2 10.1 12.0 15.8 B Elbow Wrist 62 58 4  ?B Elbow 6.6 6.6 0.0 8.2 11.0 25.5 A Elbow B Elbow 63 70 7  ?A Elbow 8.2 8.1 0.1 7.9 10.6 25.5       ? ? ? ?Waveforms: ?    ? ?    ? ?    ? ?  ?  ? ?Clinical History: ?No specialty comments available.  ? ? ? ?Objective:  VS:  HT:    WT:   BMI:     BP:   HR: bpm  TEMP: ( )  RESP:  ?Physical Exam ?Musculoskeletal:     ?   General: No tenderness.  ?   Comments: Inspection reveals no atrophy of the bilateral APB or FDI or hand intrinsics. There is no swelling, color changes, allodynia or dystrophic changes. There is 5 out of 5 strength in the bilateral wrist extension, finger abduction and long finger flexion. There is intact sensation to light touch in all dermatomal and peripheral nerve distributions. Bretta Bang is a negative Hoffmann's test bilaterally.  ?Skin: ?   General: Skin is warm and dry.  ?   Findings: No erythema or rash.  ?Neurological:  ?   General: No focal deficit present.  ?   Mental Status: He is alert and oriented to person, place, and time.  ?   Sensory: No sensory deficit.  ?   Motor: No weakness or abnormal muscle tone.  ?   Coordination: Coordination normal.  ?   Gait: Gait  normal.  ?Psychiatric:     ?   Mood and Affect: Mood normal.     ?   Behavior: Behavior normal.     ?   Thought Content: Thought content normal.  ?  ? ?Imaging: ?No results found. ?

## 2021-10-05 ENCOUNTER — Ambulatory Visit: Payer: BC Managed Care – PPO | Admitting: Orthopaedic Surgery

## 2021-10-05 ENCOUNTER — Encounter: Payer: Self-pay | Admitting: Physician Assistant

## 2021-10-05 ENCOUNTER — Encounter: Payer: Self-pay | Admitting: Orthopaedic Surgery

## 2021-10-05 ENCOUNTER — Other Ambulatory Visit: Payer: Self-pay

## 2021-10-05 VITALS — BP 127/77 | HR 75 | Ht 70.0 in | Wt 185.0 lb

## 2021-10-05 DIAGNOSIS — M542 Cervicalgia: Secondary | ICD-10-CM | POA: Diagnosis not present

## 2021-10-05 DIAGNOSIS — M4722 Other spondylosis with radiculopathy, cervical region: Secondary | ICD-10-CM

## 2021-10-05 DIAGNOSIS — G5603 Carpal tunnel syndrome, bilateral upper limbs: Secondary | ICD-10-CM

## 2021-10-05 NOTE — Progress Notes (Signed)
? ?Office Visit Note ?  ?Patient: Karl Davis           ?Date of Birth: 1961-05-24           ?MRN: [id number] ?Visit Date: 10/05/2021 ?             ?Requested by: Jeanie Sewer, NP ?[place],  [place] [address] ?PCP: Jeanie Sewer, NP ? ? ?Assessment & Plan: ?Visit Diagnoses:  ?1. Bilateral carpal tunnel syndrome   ?2. Other spondylosis with radiculopathy, cervical region   ? ? ?Plan: He will use the wrist splint at night.  He can decide if he wants to use it when he is typing.  He like to proceed with outpatient carpal tunnel release likely local standby anesthesia.  We discussed sutures remaining in for 2 weeks.  His neck pain is progressed dysphagia is progressing and we will proceed with cervical MRI scan. ? ?Follow-Up Instructions: No follow-ups on file.  ? ?Orders:  ?No orders of the defined types were placed in this encounter. ? ?No orders of the defined types were placed in this encounter. ? ? ? ? Procedures: ?No procedures performed ? ? ?Clinical Data: ?No additional findings. ? ? ?Subjective: ?Chief Complaint  ?Patient presents with  ? Neck - Pain, Follow-up  ?  EMG/NCS review  ? Right Hand - Follow-up, Numbness  ?  EMG/NCS review  ? Left Hand - Follow-up, Numbness  ?  EMG/NCS review  ? ? ?HPI 61 year old male returns with ongoing problems with right hand numbness and pain.  Previous C5-6 cervical fusion years ago and significant spurring anteriorly at other levels up to 1415 mm.  He has some dysphagia problems likely from large spurs at C3 and C4 anteriorly.  He continues to have numbness in his hand that bothers him as well as neck chronic pain.  Electrical test shows moderate right carpal tunnel mild left.  More symptoms on the right than left.  Patient's neck bothers him chronically and has noticed dysphagia is gradually gotten worse with time.  He has not had any imaging studies in his neck since his previous C5-6 fusion 20 years ago. ? ?Review of Systems ?All of  systems updated unchanged. ? ?Objective: ?Vital Signs: BP 127/77   Pulse 75   Ht '5\' 10"'$  (1.778 m)   Wt 185 lb (83.9 kg)   BMI 26.54 kg/m?  ? ?Physical Exam ?Constitutional:   ?   Appearance: He is well-developed.  ?HENT:  ?   Head: Normocephalic and atraumatic.  ?   Right Ear: External ear normal.  ?   Left Ear: External ear normal.  ?Eyes:  ?   Pupils: Pupils are equal, round, and reactive to light.  ?Neck:  ?   Thyroid: No thyromegaly.  ?   Trachea: No tracheal deviation.  ?Cardiovascular:  ?   Rate and Rhythm: Normal rate.  ?Pulmonary:  ?   Effort: Pulmonary effort is normal.  ?   Breath sounds: No wheezing.  ?Abdominal:  ?   General: Bowel sounds are normal.  ?   Palpations: Abdomen is soft.  ?Musculoskeletal:  ?   Cervical back: Neck supple.  ?Skin: ?   General: Skin is warm and dry.  ?   Capillary Refill: Capillary refill takes less than 2 seconds.  ?Neurological:  ?   Mental Status: He is alert and oriented to person, place, and time.  ?Psychiatric:     ?   Behavior: Behavior normal.     ?  Thought Content: Thought content normal.     ?   Judgment: Judgment normal.  ? ? ?Ortho Exam bilateral brachial plexus tenderness.  Forward flexion chin 2 fingerbreadths chin to chest.  He has 70% extension.  Bilateral positive Spurling.  Reflexes are 2+.  Slight thenar weakness on the right normal on the left.  No lower extremity clonus normal heel toe gait.  Biceps triceps is strong wrist flexion extension is symmetrical. ? ?Specialty Comments:  ?No specialty comments available. ? ?Imaging: ?No results found. ? ? ?PMFS History: ?Patient Active Problem List  ? Diagnosis Date Noted  ? Bilateral carpal tunnel syndrome 10/05/2021  ? Other spondylosis with radiculopathy, cervical region 10/05/2021  ? Attention deficit hyperactivity disorder (ADHD), predominantly inattentive type 09/02/2021  ? 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 Lipoma 08/29/2021  ? History of cervical spinal surgery 08/29/2021  ? Numbness and tingling in  right hand 08/29/2021  ? Erectile dysfunction 08/26/2021  ? ?Past Medical History:  ?Diagnosis Date  ? Arthritis   ? GERD (gastroesophageal reflux disease)   ? Substance abuse ([place]) 1981  ?  ?Family History  ?Problem Relation Age of Onset  ? Depression Mother   ? Diabetes Mother   ? ADD / ADHD Son   ? Depression Sister   ? Depression Sister   ?  ?Past Surgical History:  ?Procedure Laterality Date  ? FRACTURE SURGERY  02/1981  ? 4th metacarpal Left hand  ? HERNIA REPAIR  1997? 1998?  ? Rt side  ? SPINE SURGERY  09/2001  ? Neck surgery C4/C5  ? ?Social History  ? ?Occupational History  ? Not on file  ?Tobacco Use  ? Smoking status: Never  ?  Passive exposure: Yes  ? Smokeless tobacco: Former  ?Substance and Sexual Activity  ? Alcohol use: Not Currently  ? Drug use: Not Currently  ?  Types: Amphetamines, "Crack" cocaine, Marijuana, LSD  ? Sexual activity: Yes  ? ? ? ? ? ? ?

## 2021-10-17 ENCOUNTER — Other Ambulatory Visit: Payer: Self-pay | Admitting: Orthopaedic Surgery

## 2021-10-17 HISTORY — PX: OTHER SURGICAL HISTORY: SHX169

## 2021-10-17 MED ORDER — HYDROCODONE-ACETAMINOPHEN 5-325 MG PO TABS
1.0000 | ORAL_TABLET | Freq: Four times a day (QID) | ORAL | 0 refills | Status: DC | PRN
Start: 2021-10-17 — End: 2022-01-26

## 2021-10-17 NOTE — Progress Notes (Signed)
norco

## 2021-10-19 NOTE — Progress Notes (Signed)
? ? ? ?10/24/2021 ?Amillion Macchia ?[id number] ?17-Jun-1961 ? ?Primary GI: Dr. Silverio Decamp ? ?ASSESSMENT AND PLAN:  ? ?Screen for colon cancer ?We have discussed the risks of bleeding, infection, perforation, medication reactions, and remote risk of death associated with colonoscopy. All questions were answered and the patient acknowledges these risk and wishes to proceed. ? ?Gastroesophageal reflux disease without esophagitis ?Will start the patient on a pepcid daily at this time, will schedule for EGD ?Lifestyle changes discussed, avoid NSAIDS, ETOH ? ?Pharyngoesophageal dysphagia ? EGD to evaluate for structural abnormality, tumor, erosive/infectious esophagititis, and EOE.   ?Could be from previous cervical fusion surgery but with NSAID use will schedule EGd to evaluate.  ?If the EGD is negative can then proceed to barium swallow and/or esophageal manometry. ?I discussed risks of EGD with patient today, including risk of sedation, bleeding or perforation.  ?Patient provides understanding and gave verbal consent to proceed. ? ? ?Patient Care Team: ?Jeanie Sewer, NP as PCP - General (Family Medicine) ? ?HISTORY OF PRESENT ILLNESS: ?61 y.o. male referred by Jeanie Sewer, NP, with a past medical history of Substance abuse, chronic pain, GERD and others listed below presents for evaluation of GERD and repeat colonoscopy. .  ? ?Previously seen by [facility] , Dr. Serena Colonel.  ?07/22/2010 2 hyperplastic polyps removed recall 10 years ?Patient denies change in bowel habits, constipation, diarrhea, hematochezia.  ?No family history of GI malignancy. ? ?He does have history of GERD, worse with certain foods.  ?IE [place] and city BBQ. Will take tums or pepcid 4-5 x a week.  ?No nocturnal symptoms. No issues with liquids.  ?He states if he has his chin tucked will have get choked or cough a lot, worse with nuts/seeds.  ?He has had C4-C5 cervical fusion in the past 2003, recent cervical neck xray showed spurs that  he states could be contributing to his swallowing. ?He denies abdominal pain, melena ? Denies ETOH use.  He does take '600mg'$  ibuprofen 1-2 x a day daily for 5 years.  ?Patient is not on a blood thinner.  ?No weight loss, no night sweats. ? ?CBC  08/26/2021  ?HGB 16.5 MCV 88.7 without evidence of anemia ?WBC 7.6 Platelets 187 ?Kidney function 08/26/2021  ?BUN 16 Cr 0.85  ?Potassium 4.2   ?LFTs 08/26/2021  ?AST 19 ALT 21 ?08/26/2021 TSH 1.76 ? ? ?Current Medications:  ? ? ? ? ?Current Outpatient Medications (Analgesics):  ?  HYDROcodone-acetaminophen (NORCO/VICODIN) 5-325 MG tablet, Take 1-2 tablets by mouth every 6 (six) hours as needed for moderate pain. (Patient not taking: Reported on 10/24/2021) ?  ibuprofen (ADVIL) 200 MG tablet, 200 mg  prn as needed ? ? ?Current Outpatient Medications (Other):  ?  buPROPion (WELLBUTRIN SR) 150 MG 12 hr tablet, Take one tablet by mouth daily for 3 days, and then increase to one tablet by mouth twice daily. ?  famotidine (PEPCID) 20 MG tablet, 20 mg  prn as needed ?  omeprazole (PRILOSEC OTC) 20 MG tablet, Take 20 mg by mouth daily. ? ?Medical History:  ?Past Medical History:  ?Diagnosis Date  ? Arthritis   ? GERD (gastroesophageal reflux disease)   ? Substance abuse ([place]) 1981  ? ?Allergies: No Known Allergies  ? ?Surgical History:  ?He  has a past surgical history that includes Spine surgery (09/2001); Fracture surgery (02/1981); and Hernia repair (1997? 1998?). ?Family History:  ?His family history includes ADD / ADHD in his son; Depression in his mother, sister, and sister; Diabetes in  his mother. ?Social History:  ? reports that he has quit smoking. His smoking use included cigars. He has been exposed to tobacco smoke. He has quit using smokeless tobacco. He reports that he does not currently use alcohol. He reports that he does not currently use drugs after having used the following drugs: Amphetamines, "Crack" cocaine, Marijuana, and LSD. ? ?REVIEW OF SYSTEMS  : All other  systems reviewed and negative except where noted in the History of Present Illness. ? ? ?PHYSICAL EXAM: ?BP 118/80   Pulse 64   Ht '5\' 10"'$  (1.778 m)   Wt 188 lb 3.2 oz (85.4 kg)   BMI 27.00 kg/m?  ?General:   Pleasant, well developed male in no acute distress ?Head:  Normocephalic and atraumatic. ?Eyes: sclerae anicteric,conjunctive pink  ?Heart:  regular rate and rhythm, no murmurs or gallops ?Pulm: Clear anteriorly; no wheezing ?Abdomen:   Soft, Non-distended AB, Active bowel sounds. No tenderness . Without guarding and Without rebound, No organomegaly appreciated. ?Extremities:  Without edema. ?Msk:  Symmetrical without gross deformities. Peripheral pulses intact.  ?Neurologic:  Alert and  oriented x4;  No focal deficits.  ?Skin:   Dry and intact without significant lesions or rashes. ?Psychiatric: Cooperative. Normal mood and affect. ? ? ? ?Vladimir Crofts, PA-C ?8:57 AM ? ? ?

## 2021-10-24 ENCOUNTER — Encounter: Payer: Self-pay | Admitting: Physician Assistant

## 2021-10-24 ENCOUNTER — Ambulatory Visit: Payer: BC Managed Care – PPO | Admitting: Physician Assistant

## 2021-10-24 VITALS — BP 118/80 | HR 64 | Ht 70.0 in | Wt 188.2 lb

## 2021-10-24 DIAGNOSIS — R1314 Dysphagia, pharyngoesophageal phase: Secondary | ICD-10-CM

## 2021-10-24 DIAGNOSIS — Z1211 Encounter for screening for malignant neoplasm of colon: Secondary | ICD-10-CM

## 2021-10-24 DIAGNOSIS — K219 Gastro-esophageal reflux disease without esophagitis: Secondary | ICD-10-CM

## 2021-10-24 MED ORDER — NA SULFATE-K SULFATE-MG SULF 17.5-3.13-1.6 GM/177ML PO SOLN: 1.0000 | Freq: Once | ORAL | 0 refills | Status: AC

## 2021-10-24 NOTE — Patient Instructions (Addendum)
Can do pepcid daily for now until EGD.  ?Avoid spicy and acidic foods ?Avoid fatty foods ?Limit your intake of coffee, tea, alcohol, and carbonated drinks ?Work to maintain a healthy weight ?Keep the head of the bed elevated at least 3 inches with blocks or a wedge pillow if you are having any nighttime symptoms ?Stay upright for 2 hours after eating ?Avoid meals and snacks three to four hours before bedtime ? ?Gastroesophageal Reflux Disease, Adult ?Gastroesophageal reflux (GER) happens when acid from the stomach flows up into the tube that connects the mouth and the stomach (esophagus). Normally, food travels down the esophagus and stays in the stomach to be digested. However, when a person has GER, food and stomach acid sometimes move back up into the esophagus. If this becomes a more serious problem, the person may be diagnosed with a disease called gastroesophageal reflux disease (GERD). GERD occurs when the reflux: ?Happens often. ?Causes frequent or severe symptoms. ?Causes problems such as damage to the esophagus. ?When stomach acid comes in contact with the esophagus, the acid may cause inflammation in the esophagus. Over time, GERD may create small holes (ulcers) in the lining of the esophagus. ?What are the causes? ?This condition is caused by a problem with the muscle between the esophagus and the stomach (lower esophageal sphincter, or LES). Normally, the LES muscle closes after food passes through the esophagus to the stomach. When the LES is weakened or abnormal, it does not close properly, and that allows food and stomach acid to go back up into the esophagus. ?The LES can be weakened by certain dietary substances, medicines, and medical conditions, including: ?Tobacco use. ?Pregnancy. ?Having a hiatal hernia. ?Alcohol use. ?Certain foods and beverages, such as coffee, chocolate, onions, and peppermint. ?What increases the risk? ?You are more likely to develop this condition if you: ?Have an  increased body weight. ?Have a connective tissue disorder. ?Take NSAIDs, such as ibuprofen. ?What are the signs or symptoms? ?Symptoms of this condition include: ?Heartburn. ?Difficult or painful swallowing and the feeling of having a lump in the throat. ?A bitter taste in the mouth. ?Bad breath and having a large amount of saliva. ?Having an upset or bloated stomach and belching. ?Chest pain. Different conditions can cause chest pain. Make sure you see your health care provider if you experience chest pain. ?Shortness of breath or wheezing. ?Ongoing (chronic) cough or a nighttime cough. ?Wearing away of tooth enamel. ?Weight loss. ?How is this diagnosed? ?This condition may be diagnosed based on a medical history and a physical exam. To determine if you have mild or severe GERD, your health care provider may also monitor how you respond to treatment. You may also have tests, including: ?A test to examine your stomach and esophagus with a small camera (endoscopy). ?A test that measures the acidity level in your esophagus. ?A test that measures how much pressure is on your esophagus. ?A barium swallow or modified barium swallow test to show the shape, size, and functioning of your esophagus. ?How is this treated? ?Treatment for this condition may vary depending on how severe your symptoms are. Your health care provider may recommend: ?Changes to your diet. ?Medicine. ?Surgery. ?The goal of treatment is to help relieve your symptoms and to prevent complications. ?Follow these instructions at home: ?Eating and drinking ? ?Follow a diet as recommended by your health care provider. This may involve avoiding foods and drinks such as: ?Coffee and tea, with or without caffeine. ?Drinks that contain alcohol. ?  Energy drinks and sports drinks. ?Carbonated drinks or sodas. ?Chocolate and cocoa. ?Peppermint and mint flavorings. ?Garlic and onions. ?Horseradish. ?Spicy and acidic foods, including peppers, chili powder, curry  powder, vinegar, hot sauces, and barbecue sauce. ?Citrus fruit juices and citrus fruits, such as oranges, lemons, and limes. ?Tomato-based foods, such as red sauce, chili, salsa, and pizza with red sauce. ?Fried and fatty foods, such as donuts, french fries, potato chips, and high-fat dressings. ?High-fat meats, such as hot dogs and fatty cuts of red and white meats, such as rib eye steak, sausage, ham, and bacon. ?High-fat dairy items, such as whole milk, butter, and cream cheese. ?Eat small, frequent meals instead of large meals. ?Avoid drinking large amounts of liquid with your meals. ?Avoid eating meals during the 2-3 hours before bedtime. ?Avoid lying down right after you eat. ?Do not exercise right after you eat. ?Lifestyle ? ?Do not use any products that contain nicotine or tobacco. These products include cigarettes, chewing tobacco, and vaping devices, such as e-cigarettes. If you need help quitting, ask your health care provider. ?Try to reduce your stress by using methods such as yoga or meditation. If you need help reducing stress, ask your health care provider. ?If you are overweight, reduce your weight to an amount that is healthy for you. Ask your health care provider for guidance about a safe weight loss goal. ?General instructions ?Pay attention to any changes in your symptoms. ?Take over-the-counter and prescription medicines only as told by your health care provider. Do not take aspirin, ibuprofen, or other NSAIDs unless your health care provider told you to take these medicines. ?Wear loose-fitting clothing. Do not wear anything tight around your waist that causes pressure on your abdomen. ?Raise (elevate) the head of your bed about 6 inches (15 cm). You can use a wedge to do this. ?Avoid bending over if this makes your symptoms worse. ?Keep all follow-up visits. This is important. ?Contact a health care provider if: ?You have: ?New symptoms. ?Unexplained weight loss. ?Difficulty swallowing or it  hurts to swallow. ?Wheezing or a persistent cough. ?A hoarse voice. ?Your symptoms do not improve with treatment. ?Get help right away if: ?You have sudden pain in your arms, neck, jaw, teeth, or back. ?You suddenly feel sweaty, dizzy, or light-headed. ?You have chest pain or shortness of breath. ?You vomit and the vomit is green, yellow, or black, or it looks like blood or coffee grounds. ?You faint. ?You have stool that is red, bloody, or black. ?You cannot swallow, drink, or eat. ?These symptoms may represent a serious problem that is an emergency. Do not wait to see if the symptoms will go away. Get medical help right away. Call your local emergency services (911 in the [place] Do not drive yourself to the hospital. ?Summary ?Gastroesophageal reflux happens when acid from the stomach flows up into the esophagus. GERD is a disease in which the reflux happens often, causes frequent or severe symptoms, or causes problems such as damage to the esophagus. ?Treatment for this condition may vary depending on how severe your symptoms are. Your health care provider may recommend diet and lifestyle changes, medicine, or surgery. ?Contact a health care provider if you have new or worsening symptoms. ?Take over-the-counter and prescription medicines only as told by your health care provider. Do not take aspirin, ibuprofen, or other NSAIDs unless your health care provider told you to do so. ?Keep all follow-up visits as told by your health care provider. This is important. ?This information  is not intended to replace advice given to you by your health care provider. Make sure you discuss any questions you have with your health care provider. ?Document Revised: 01/05/2020 Document Reviewed: 01/05/2020 ?Elsevier Patient Education ? [place]. ? ?

## 2021-10-25 ENCOUNTER — Encounter: Payer: Self-pay | Admitting: Orthopaedic Surgery

## 2021-10-25 ENCOUNTER — Other Ambulatory Visit: Payer: BC Managed Care – PPO

## 2021-10-25 ENCOUNTER — Ambulatory Visit (INDEPENDENT_AMBULATORY_CARE_PROVIDER_SITE_OTHER): Payer: BC Managed Care – PPO | Admitting: Orthopaedic Surgery

## 2021-10-25 ENCOUNTER — Ambulatory Visit
Admission: RE | Admit: 2021-10-25 | Discharge: 2021-10-25 | Disposition: A | Discharge status: Home / Self Care | Payer: BC Managed Care – PPO | Source: Ambulatory Visit | Attending: Orthopaedic Surgery | Admitting: Orthopaedic Surgery

## 2021-10-25 VITALS — BP 134/80 | HR 62 | Ht 70.0 in | Wt 188.0 lb

## 2021-10-25 DIAGNOSIS — R200 Anesthesia of skin: Secondary | ICD-10-CM

## 2021-10-25 DIAGNOSIS — M4722 Other spondylosis with radiculopathy, cervical region: Secondary | ICD-10-CM

## 2021-10-25 DIAGNOSIS — Z9889 Other specified postprocedural states: Secondary | ICD-10-CM

## 2021-10-25 DIAGNOSIS — R202 Paresthesia of skin: Secondary | ICD-10-CM

## 2021-10-25 DIAGNOSIS — M542 Cervicalgia: Secondary | ICD-10-CM

## 2021-10-25 NOTE — Progress Notes (Signed)
? ?  Post-Op Visit Note ?  ?Patient: Karl Davis           ?Date of Birth: 11/08/1960           ?MRN: [id number] ?Visit Date: 10/25/2021 ?PCP: Jeanie Sewer, NP ? ? ?Assessment & Plan: Follow-up carpal tunnel release right hand has noticed improvement in his pain improvement in sensation.  He did have cervical spondylosis at previous single level fusion by Dr. Jackolyn Confer with large anterior spurs cephalad C2-3 C3-4 and reports dysphagia.  MRI scan pending.  He has not had swallowing studies by speech pathologist.  He has upper and lower GI endoscopy coming up in May. ? ?Chief Complaint:  ?Chief Complaint  ?Patient presents with  ? Right Hand - Routine Post Op  ?  10/17/2021 right CTR  ? ?Visit Diagnoses:  ?1. Numbness and tingling in right hand   ?2. History of cervical spinal surgery   ?3. Other spondylosis with radiculopathy, cervical region   ? ? ?Plan: Return 1 week for suture removal carpal tunnel.  We can review his MRI scan at that time. ? ?Follow-Up Instructions: No follow-ups on file.  ? ?Orders:  ?No orders of the defined types were placed in this encounter. ? ?No orders of the defined types were placed in this encounter. ? ? ?Imaging: ?No results found. ? ?PMFS History: ?Patient Active Problem List  ? Diagnosis Date Noted  ? Bilateral carpal tunnel syndrome 10/05/2021  ? Other spondylosis with radiculopathy, cervical region 10/05/2021  ? Attention deficit hyperactivity disorder (ADHD), predominantly inattentive type 09/02/2021  ? 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 Lipoma 08/29/2021  ? History of cervical spinal surgery 08/29/2021  ? Numbness and tingling in right hand 08/29/2021  ? Erectile dysfunction 08/26/2021  ? ?Past Medical History:  ?Diagnosis Date  ? Arthritis   ? GERD (gastroesophageal reflux disease)   ? Substance abuse ([place]) 1981  ?  ?Family History  ?Problem Relation Age of Onset  ? Depression Mother   ? Diabetes Mother   ? ADD / ADHD Son   ? Depression Sister   ? Depression  Sister   ?  ?Past Surgical History:  ?Procedure Laterality Date  ? FRACTURE SURGERY  02/1981  ? 4th metacarpal Left hand  ? HERNIA REPAIR  1997? 1998?  ? Rt side  ? SPINE SURGERY  09/2001  ? Neck surgery C4/C5  ? ?Social History  ? ?Occupational History  ? Not on file  ?Tobacco Use  ? Smoking status: Former  ?  Types: Cigars  ?  Passive exposure: Yes  ? Smokeless tobacco: Former  ?Vaping Use  ? Vaping Use: Never used  ?Substance and Sexual Activity  ? Alcohol use: Not Currently  ? Drug use: Not Currently  ?  Types: Amphetamines, "Crack" cocaine, Marijuana, LSD  ? Sexual activity: Yes  ? ? ? ?

## 2021-11-02 ENCOUNTER — Ambulatory Visit (INDEPENDENT_AMBULATORY_CARE_PROVIDER_SITE_OTHER): Payer: BC Managed Care – PPO | Admitting: Orthopaedic Surgery

## 2021-11-02 ENCOUNTER — Encounter: Payer: Self-pay | Admitting: Orthopaedic Surgery

## 2021-11-02 VITALS — BP 127/87 | HR 62 | Ht 70.0 in | Wt 188.0 lb

## 2021-11-02 DIAGNOSIS — R200 Anesthesia of skin: Secondary | ICD-10-CM

## 2021-11-02 DIAGNOSIS — R202 Paresthesia of skin: Secondary | ICD-10-CM

## 2021-11-02 DIAGNOSIS — Z9889 Other specified postprocedural states: Secondary | ICD-10-CM

## 2021-11-02 NOTE — Progress Notes (Signed)
? ?  Post-Op Visit Note ?  ?Patient: Karl Davis           ?Date of Birth: 04/28/1961           ?MRN: [id number] ?Visit Date: 11/02/2021 ?PCP: Jeanie Sewer, NP ? ? ?Assessment & Plan: Post right carpal tunnel release.  Sutures removed.  Incision looks good.  I will recheck him in 3 months. ? ?Chief Complaint:  ?Chief Complaint  ?Patient presents with  ? Right Wrist - Routine Post Op  ? Neck - Follow-up  ? ?Visit Diagnoses:  ?1. Numbness and tingling in right hand   ?2. History of cervical spinal surgery   ? ? ?Plan: We reviewed his previous MRI scan.  Solid fusion at C5-6 large anterior spurs at other levels with maintained disc space height.  Still has some dysphagia still has discomfort in his neck.  I will recheck him in 3 months. ? ?Follow-Up Instructions: Return in about 3 months (around 02/01/2022).  ? ?Orders:  ?No orders of the defined types were placed in this encounter. ? ?No orders of the defined types were placed in this encounter. ? ? ?Imaging: ?No results found. ? ?PMFS History: ?Patient Active Problem List  ? Diagnosis Date Noted  ? Bilateral carpal tunnel syndrome 10/05/2021  ? Other spondylosis with radiculopathy, cervical region 10/05/2021  ? Attention deficit hyperactivity disorder (ADHD), predominantly inattentive type 09/02/2021  ? 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 Lipoma 08/29/2021  ? History of cervical spinal surgery 08/29/2021  ? Numbness and tingling in right hand 08/29/2021  ? Erectile dysfunction 08/26/2021  ? ?Past Medical History:  ?Diagnosis Date  ? Arthritis   ? GERD (gastroesophageal reflux disease)   ? Substance abuse ([place]) 1981  ?  ?Family History  ?Problem Relation Age of Onset  ? Depression Mother   ? Diabetes Mother   ? ADD / ADHD Son   ? Depression Sister   ? Depression Sister   ?  ?Past Surgical History:  ?Procedure Laterality Date  ? FRACTURE SURGERY  02/1981  ? 4th metacarpal Left hand  ? HERNIA REPAIR  1997? 1998?  ? Rt side  ? SPINE SURGERY  09/2001  ? Neck  surgery C4/C5  ? ?Social History  ? ?Occupational History  ? Not on file  ?Tobacco Use  ? Smoking status: Former  ?  Types: Cigars  ?  Passive exposure: Yes  ? Smokeless tobacco: Former  ?Vaping Use  ? Vaping Use: Never used  ?Substance and Sexual Activity  ? Alcohol use: Not Currently  ? Drug use: Not Currently  ?  Types: Amphetamines, "Crack" cocaine, Marijuana, LSD  ? Sexual activity: Yes  ? ? ? ?

## 2021-11-14 ENCOUNTER — Encounter: Payer: Self-pay | Admitting: Gastroenterology

## 2021-11-21 ENCOUNTER — Encounter: Payer: Self-pay | Admitting: Gastroenterology

## 2021-11-21 ENCOUNTER — Other Ambulatory Visit: Payer: Self-pay | Admitting: Family

## 2021-11-21 ENCOUNTER — Ambulatory Visit (AMBULATORY_SURGERY_CENTER): Payer: BC Managed Care – PPO | Admitting: Gastroenterology

## 2021-11-21 VITALS — BP 125/87 | HR 68 | Temp 97.8°F | Resp 11 | Ht 70.0 in | Wt 188.0 lb

## 2021-11-21 DIAGNOSIS — D122 Benign neoplasm of ascending colon: Secondary | ICD-10-CM

## 2021-11-21 DIAGNOSIS — K2970 Gastritis, unspecified, without bleeding: Secondary | ICD-10-CM

## 2021-11-21 DIAGNOSIS — K2210 Ulcer of esophagus without bleeding: Secondary | ICD-10-CM

## 2021-11-21 DIAGNOSIS — K219 Gastro-esophageal reflux disease without esophagitis: Secondary | ICD-10-CM | POA: Diagnosis not present

## 2021-11-21 DIAGNOSIS — Z1211 Encounter for screening for malignant neoplasm of colon: Secondary | ICD-10-CM

## 2021-11-21 DIAGNOSIS — R1310 Dysphagia, unspecified: Secondary | ICD-10-CM | POA: Diagnosis not present

## 2021-11-21 DIAGNOSIS — K208 Other esophagitis without bleeding: Secondary | ICD-10-CM | POA: Diagnosis not present

## 2021-11-21 DIAGNOSIS — K635 Polyp of colon: Secondary | ICD-10-CM | POA: Diagnosis not present

## 2021-11-21 DIAGNOSIS — F900 Attention-deficit hyperactivity disorder, predominantly inattentive type: Secondary | ICD-10-CM

## 2021-11-21 MED ORDER — PANTOPRAZOLE SODIUM 40 MG PO TBEC: 40.0000 mg | DELAYED_RELEASE_TABLET | Freq: Every day | ORAL | 2 refills | Status: DC

## 2021-11-21 MED ORDER — SODIUM CHLORIDE 0.9 % IV SOLN: 500.0000 mL | Freq: Once | INTRAVENOUS | Status: DC

## 2021-11-21 NOTE — Progress Notes (Signed)
1430 Robinul 0.1 mg IV given due large amount of secretions upon assessment.  MD made aware, vss  ?

## 2021-11-21 NOTE — Op Note (Signed)
[facility] ?Patient Name: Karl Davis ?Procedure Date: 11/21/2021 2:02 PM ?MRN: [id number] ?Endoscopist: Mauri Pole , MD ?Age: 61 ?Referring MD:  ?Date of Birth: [birth year] ?Gender: Male ?Account #: [id number] ?Procedure:                Colonoscopy ?Indications:              Screening for colorectal malignant neoplasm ?Medicines:                Monitored Anesthesia Care ?Procedure:                Pre-Anesthesia Assessment: ?                          - Prior to the procedure, a History and Physical  ?                          was performed, and patient medications and  ?                          allergies were reviewed. The patient's tolerance of  ?                          previous anesthesia was also reviewed. The risks  ?                          and benefits of the procedure and the sedation  ?                          options and risks were discussed with the patient.  ?                          All questions were answered, and informed consent  ?                          was obtained. Prior Anticoagulants: The patient has  ?                          taken no previous anticoagulant or antiplatelet  ?                          agents. ASA Grade Assessment: II - A patient with  ?                          mild systemic disease. After reviewing the risks  ?                          and benefits, the patient was deemed in  ?                          satisfactory condition to undergo the procedure. ?                          After obtaining informed consent, the colonoscope  ?  was passed under direct vision. Throughout the  ?                          procedure, the patient's blood pressure, pulse, and  ?                          oxygen saturations were monitored continuously. The  ?                          Olympus [id number]-[id number] (#[id number]) Colonoscope was  ?                          introduced through the anus and advanced to the the  ?                          cecum,  identified by appendiceal orifice and  ?                          ileocecal valve. The colonoscopy was performed  ?                          without difficulty. The patient tolerated the  ?                          procedure well. The quality of the bowel  ?                          preparation was good. The ileocecal valve,  ?                          appendiceal orifice, and rectum were photographed. ?Scope In: 2:44:47 PM ?Scope Out: 2:57:10 PM ?Scope Withdrawal Time: 0 hours 9 minutes 9 seconds  ?Total Procedure Duration: 0 hours 12 minutes 23 seconds  ?Findings:                 The perianal and digital rectal examinations were  ?                          normal. ?                          A 5 mm polyp was found in the ascending colon. The  ?                          polyp was sessile. The polyp was removed with a  ?                          cold snare. Resection and retrieval were complete. ?                          Non-bleeding external and internal hemorrhoids were  ?                          found during retroflexion. The hemorrhoids were  ?  small. ?Complications:            No immediate complications. ?Estimated Blood Loss:     Estimated blood loss was minimal. ?Impression:               - One 5 mm polyp in the ascending colon, removed  ?                          with a cold snare. Resected and retrieved. ?                          - Non-bleeding external and internal hemorrhoids. ?Recommendation:           - Patient has a contact number available for  ?                          emergencies. The signs and symptoms of potential  ?                          delayed complications were discussed with the  ?                          patient. Return to normal activities tomorrow.  ?                          Written discharge instructions were provided to the  ?                          patient. ?                          - Resume previous diet. ?                          - Continue present  medications. ?                          - Await pathology results. ?                          - Repeat colonoscopy in 5-10 years for surveillance  ?                          based on pathology results. ?Mauri Pole, MD ?11/21/2021 3:06:05 PM ?This report has been signed electronically. ?

## 2021-11-21 NOTE — Progress Notes (Signed)
[facility] History and Physical ? ? ?Primary Care Physician:  Jeanie Sewer, NP ? ? ?Reason for Procedure:  GERD and colorectal cancer screening ? ?Plan:    EGD and colonoscopy with possible interventions as needed ? ? ? ? ?HPI: Karl Davis is a very pleasant 61 y.o. male here for EGD for evaluation of persistent GERD symptoms and dysphagia, is due for colonoscopy for colorectal cancer screening. ?Denies any nausea, vomiting, abdominal pain, melena or bright red blood per rectum ? ?The risks and benefits as well as alternatives of endoscopic procedure(s) have been discussed and reviewed. All questions answered. The patient agrees to proceed. ? ? ? ?Past Medical History:  ?Diagnosis Date  ? Arthritis   ? GERD (gastroesophageal reflux disease)   ? Substance abuse ([place]) 1981  ? ? ?Past Surgical History:  ?Procedure Laterality Date  ? carpal right hand Right 10/17/2021  ? FRACTURE SURGERY  02/1981  ? 4th metacarpal Left hand  ? HERNIA REPAIR  1997? 1998?  ? Rt side  ? SPINE SURGERY  09/2001  ? Neck surgery C4/C5  ? ? ?Prior to Admission medications   ?Medication Sig Start Date End Date Taking? Authorizing Provider  ?buPROPion (WELLBUTRIN SR) 150 MG 12 hr tablet TAKE 1 TABLET BY MOUTH DAILY FOR 3 DAYS. INCREASE TO 1 TABLET BY MOUTH 2 TIMES DAILY 11/21/21  Yes Jeanie Sewer, NP  ?HYDROcodone-acetaminophen (NORCO/VICODIN) 5-325 MG tablet Take 1-2 tablets by mouth every 6 (six) hours as needed for moderate pain. 10/17/21  Yes Marybelle Killings, MD  ?ibuprofen (ADVIL) 200 MG tablet 200 mg  prn as needed 01/22/08  Yes [provider]  ?omeprazole (PRILOSEC OTC) 20 MG tablet Take 20 mg by mouth daily.   Yes [provider]  ?famotidine (PEPCID) 20 MG tablet 20 mg  prn as needed 01/22/08   [provider]  ? ? ?Current Outpatient Medications  ?Medication Sig Dispense Refill  ? buPROPion (WELLBUTRIN SR) 150 MG 12 hr tablet TAKE 1 TABLET BY MOUTH DAILY FOR 3 DAYS. INCREASE TO 1  TABLET BY MOUTH 2 TIMES DAILY 60 tablet 0  ? HYDROcodone-acetaminophen (NORCO/VICODIN) 5-325 MG tablet Take 1-2 tablets by mouth every 6 (six) hours as needed for moderate pain. 30 tablet 0  ? ibuprofen (ADVIL) 200 MG tablet 200 mg  prn as needed    ? omeprazole (PRILOSEC OTC) 20 MG tablet Take 20 mg by mouth daily.    ? famotidine (PEPCID) 20 MG tablet 20 mg  prn as needed    ? ?Current Facility-Administered Medications  ?Medication Dose Route Frequency Provider Last Rate Last Admin  ? 0.9 %  sodium chloride infusion  500 mL Intravenous Once Nhia Heaphy, Venia Minks, MD      ? ? ?Allergies as of 11/21/2021  ? (No Known Allergies)  ? ? ?Family History  ?Problem Relation Age of Onset  ? Depression Mother   ? Diabetes Mother   ? Prostate cancer Father   ? Depression Sister   ? Depression Sister   ? ADD / ADHD Son   ? ? ?Social History  ? ?Socioeconomic History  ? Marital status: Divorced  ?  Spouse name: Not on file  ? Number of children: Not on file  ? Years of education: Not on file  ? Highest education level: Not on file  ?Occupational History  ? Not on file  ?Tobacco Use  ? Smoking status: Former  ?  Types: Cigars  ?  Passive exposure: Yes  ?  Smokeless tobacco: Former  ?Vaping Use  ? Vaping Use: Never used  ?Substance and Sexual Activity  ? Alcohol use: Not Currently  ? Drug use: Not Currently  ?  Types: Amphetamines, "Crack" cocaine, Marijuana, LSD  ? Sexual activity: Yes  ?Other Topics Concern  ? Not on file  ?Social History Narrative  ? Not on file  ? ?Social Determinants of Health  ? ?Financial Resource Strain: Not on file  ?Food Insecurity: Not on file  ?Transportation Needs: Not on file  ?Physical Activity: Not on file  ?Stress: Not on file  ?Social Connections: Not on file  ?Intimate Partner Violence: Not on file  ? ? ?Review of Systems: ? ?All other review of systems negative except as mentioned in the HPI. ? ?Physical Exam: ?Vital signs in last 24 hours: ?BP 126/80   Pulse 67   Temp 97.8 ?F (36.6 ?C) (Skin)    Ht '5\' 10"'$  (1.778 m)   Wt 188 lb (85.3 kg)   SpO2 98%   BMI 26.98 kg/m?  ?General:   Alert, NAD ?Lungs:  Clear .   ?Heart:  Regular rate and rhythm ?Abdomen:  Soft, nontender and nondistended. ?Neuro/Psych:  Alert and cooperative. Normal mood and affect. A and O x 3 ? ?Reviewed labs, radiology imaging, old records and pertinent past GI work up ? ?Patient is appropriate for planned procedure(s) and anesthesia in an ambulatory setting ? ? ?K. Denzil Magnuson , MD ?[phone]  ? ? ?  ?

## 2021-11-21 NOTE — Patient Instructions (Addendum)
Thank you for letting us take care of your healthcare needs today. Please see handouts given to you on Polyps, Hemorrhoids, Anti REflux Regimen and Hiatal Hernia. ?Pick up you new Rx for Omeprazole 40 mg daily. ?Follow up appt in 2 months.. please schedule. ? ? ? ? ?YOU HAD AN ENDOSCOPIC PROCEDURE TODAY AT [facility]:   Refer to the procedure report that was given to you for any specific questions about what was found during the examination.  If the procedure report does not answer your questions, please call your gastroenterologist to clarify.  If you requested that your care partner not be given the details of your procedure findings, then the procedure report has been included in a sealed envelope for you to review at your convenience later. ? ?YOU SHOULD EXPECT: Some feelings of bloating in the abdomen. Passage of more gas than usual.  Walking can help get rid of the air that was put into your GI tract during the procedure and reduce the bloating. If you had a lower endoscopy (such as a colonoscopy or flexible sigmoidoscopy) you may notice spotting of blood in your stool or on the toilet paper. If you underwent a bowel prep for your procedure, you may not have a normal bowel movement for a few days. ? ?Please Note:  You might notice some irritation and congestion in your nose or some drainage.  This is from the oxygen used during your procedure.  There is no need for concern and it should clear up in a day or so. ? ?SYMPTOMS TO REPORT IMMEDIATELY: ? ?Following lower endoscopy (colonoscopy or flexible sigmoidoscopy): ? Excessive amounts of blood in the stool ? Significant tenderness or worsening of abdominal pains ? Swelling of the abdomen that is new, acute ? Fever of 100?F or higher ? ?Following upper endoscopy (EGD) ? Vomiting of blood or coffee ground material ? New chest pain or pain under the shoulder blades ? Painful or persistently difficult swallowing ? New shortness of breath ? Fever  of 100?F or higher ? Black, tarry-looking stools ? ?For urgent or emergent issues, a gastroenterologist can be reached at any hour by calling [phone]. ?Do not use MyChart messaging for urgent concerns.  ? ? ?DIET:  We do recommend a small meal at first, but then you may proceed to your regular diet.  Drink plenty of fluids but you should avoid alcoholic beverages for 24 hours. ? ?ACTIVITY:  You should plan to take it easy for the rest of today and you should NOT DRIVE or use heavy machinery until tomorrow (because of the sedation medicines used during the test).   ? ?FOLLOW UP: ?Our staff will call the number listed on your records 48-72 hours following your procedure to check on you and address any questions or concerns that you may have regarding the information given to you following your procedure. If we do not reach you, we will leave a message.  We will attempt to reach you two times.  During this call, we will ask if you have developed any symptoms of COVID 19. If you develop any symptoms (ie: fever, flu-like symptoms, shortness of breath, cough etc.) before then, please call [phone].  If you test positive for Covid 19 in the 2 weeks post procedure, please call and report this information to [place].   ? ?If any biopsies were taken you will be contacted by phone or by letter within the next 1-3 weeks.  Please call us at [phone] if you have not heard about the biopsies in 3 weeks.  ? ? ?SIGNATURES/CONFIDENTIALITY: ?You and/or your care partner have signed paperwork which will be entered into your electronic medical record.  These signatures attest to the fact that that the information above on your After Visit Summary has been reviewed and is understood.  Full responsibility of the confidentiality of this discharge information lies with you and/or your care-partner.  ?

## 2021-11-21 NOTE — Progress Notes (Signed)
Report given to PACU, vss 

## 2021-11-21 NOTE — Progress Notes (Signed)
Called to room to assist during endoscopic procedure.  Patient ID and intended procedure confirmed with present staff. Received instructions for my participation in the procedure from the performing physician.  

## 2021-11-21 NOTE — Op Note (Signed)
[facility] ?Patient Name: Karl Davis ?Procedure Date: 11/21/2021 2:02 PM ?MRN: [id number] ?Endoscopist: Mauri Pole , MD ?Age: 61 ?Referring MD:  ?Date of Birth: 09-Nov-1960 ?Gender: Male ?Account #: [id number] ?Procedure:                Upper GI endoscopy ?Indications:              Dysphagia, Esophageal reflux symptoms that persist  ?                          despite appropriate therapy ?Medicines:                Monitored Anesthesia Care ?Procedure:                Pre-Anesthesia Assessment: ?                          - Prior to the procedure, a History and Physical  ?                          was performed, and patient medications and  ?                          allergies were reviewed. The patient's tolerance of  ?                          previous anesthesia was also reviewed. The risks  ?                          and benefits of the procedure and the sedation  ?                          options and risks were discussed with the patient.  ?                          All questions were answered, and informed consent  ?                          was obtained. Prior Anticoagulants: The patient has  ?                          taken no previous anticoagulant or antiplatelet  ?                          agents. ASA Grade Assessment: II - A patient with  ?                          mild systemic disease. After reviewing the risks  ?                          and benefits, the patient was deemed in  ?                          satisfactory condition to undergo the procedure. ?  After obtaining informed consent, the endoscope was  ?                          passed under direct vision. Throughout the  ?                          procedure, the patient's blood pressure, pulse, and  ?                          oxygen saturations were monitored continuously. The  ?                          GIF [id number] #[id number] was introduced through the  ?                          mouth, and advanced to the  second part of duodenum.  ?                          The upper GI endoscopy was accomplished without  ?                          difficulty. The patient tolerated the procedure  ?                          well. ?Scope In: ?Scope Out: ?Findings:                 One cratered esophageal ulcer was found 37 to 38 cm  ?                          from the incisors. The lesion was less than one mm  ?                          in largest dimension. Biopsies were taken with a  ?                          cold forceps for histology. ?                          No endoscopic abnormality was evident in the  ?                          esophagus to explain the patient's complaint of  ?                          dysphagia. It was decided, however, to proceed with  ?                          dilation of the entire esophagus. The scope was  ?                          withdrawn. Dilation was performed with a Venia Minks  ?  dilator with no resistance at 54 Fr. The dilation  ?                          site was examined following endoscope reinsertion  ?                          and showed no change. ?                          The stomach was normal. ?                          The cardia and gastric fundus were normal on  ?                          retroflexion. ?                          A 3 cm hiatal hernia was present. ?                          The examined duodenum was normal. ?Complications:            No immediate complications. ?Estimated Blood Loss:     Estimated blood loss was minimal. ?Impression:               - Esophageal ulcer. Biopsied. ?                          - No endoscopic esophageal abnormality to explain  ?                          patient's dysphagia. Esophagus dilated. Dilated. ?                          - Normal stomach. ?                          - 3 cm hiatal hernia. ?                          - Normal examined duodenum. ?Recommendation:           - Patient has a contact number available for  ?                           emergencies. The signs and symptoms of potential  ?                          delayed complications were discussed with the  ?                          patient. Return to normal activities tomorrow.  ?                          Written discharge instructions were provided to the  ?  patient. ?                          - Resume previous diet. ?                          - Continue present medications. ?                          - Await pathology results. ?                          - Return to GI office in 2 months. ?                          - Follow an antireflux regimen. ?                          - Use Protonix (pantoprazole) 40 mg PO daily. Rx  ?                          for 3 months. ?Mauri Pole, MD ?11/21/2021 3:10:16 PM ?This report has been signed electronically. ?

## 2021-11-21 NOTE — Progress Notes (Signed)
Pt's states no medical or surgical changes since previsit or office visit. 

## 2021-11-23 ENCOUNTER — Telehealth: Payer: Self-pay | Admitting: *Deleted

## 2021-11-23 NOTE — Telephone Encounter (Signed)
?  Follow up Call- ? ? ?  11/21/2021  ?  2:07 PM  ?Call back number  ?Post procedure Call Back phone  # [phone]  ?Permission to leave phone message Yes  ?  ? ?Patient questions: ? ?Do you have a fever, pain , or abdominal swelling? No. ?Pain Score  0 * ? ?Have you tolerated food without any problems? Yes.   ? ?Have you been able to return to your normal activities? Yes.   ? ?Do you have any questions about your discharge instructions: ?Diet   No. ?Medications  No. ?Follow up visit  No. ? ?Do you have questions or concerns about your Care? No. ? ?Actions: ?* If pain score is 4 or above: ?No action needed, pain <4. ? ? ?

## 2021-11-30 ENCOUNTER — Encounter: Payer: Self-pay | Admitting: Gastroenterology

## 2021-12-01 NOTE — Progress Notes (Signed)
Reviewed and agree with documentation and assessment and plan. K. Veena Iolani Twilley , MD   

## 2022-01-05 ENCOUNTER — Encounter (HOSPITAL_BASED_OUTPATIENT_CLINIC_OR_DEPARTMENT_OTHER): Payer: Self-pay | Admitting: Urology

## 2022-01-05 ENCOUNTER — Emergency Department (HOSPITAL_BASED_OUTPATIENT_CLINIC_OR_DEPARTMENT_OTHER): Payer: BC Managed Care – PPO

## 2022-01-05 ENCOUNTER — Emergency Department (HOSPITAL_BASED_OUTPATIENT_CLINIC_OR_DEPARTMENT_OTHER)
Admission: EM | Admit: 2022-01-05 | Discharge: 2022-01-05 | Disposition: A | Discharge status: Home / Self Care | Payer: BC Managed Care – PPO | Attending: Emergency Medicine | Admitting: Emergency Medicine

## 2022-01-05 ENCOUNTER — Other Ambulatory Visit: Payer: Self-pay

## 2022-01-05 DIAGNOSIS — X58XXXA Exposure to other specified factors, initial encounter: Secondary | ICD-10-CM | POA: Diagnosis not present

## 2022-01-05 DIAGNOSIS — Z23 Encounter for immunization: Secondary | ICD-10-CM | POA: Insufficient documentation

## 2022-01-05 DIAGNOSIS — Y93H2 Activity, gardening and landscaping: Secondary | ICD-10-CM | POA: Insufficient documentation

## 2022-01-05 DIAGNOSIS — S81811A Laceration without foreign body, right lower leg, initial encounter: Secondary | ICD-10-CM | POA: Diagnosis not present

## 2022-01-05 DIAGNOSIS — S81819A Laceration without foreign body, unspecified lower leg, initial encounter: Secondary | ICD-10-CM

## 2022-01-05 DIAGNOSIS — S8991XA Unspecified injury of right lower leg, initial encounter: Secondary | ICD-10-CM | POA: Diagnosis present

## 2022-01-05 MED ORDER — TETANUS-DIPHTH-ACELL PERTUSSIS 5-2.5-18.5 LF-MCG/0.5 IM SUSY
0.5000 mL | PREFILLED_SYRINGE | Freq: Once | INTRAMUSCULAR | Status: AC
  Administered 2022-01-05: 0.5 mL via INTRAMUSCULAR
  Filled 2022-01-05: qty 0.5

## 2022-01-05 MED ORDER — LIDOCAINE-EPINEPHRINE-TETRACAINE (LET) TOPICAL GEL
3.0000 mL | Freq: Once | TOPICAL | Status: AC
  Administered 2022-01-05: 3 mL via TOPICAL
  Filled 2022-01-05: qty 3

## 2022-01-05 NOTE — ED Triage Notes (Signed)
Right lower leg laceration  Bleeding controlled with pressure dressing Branch trimmer hit leg while trimming branches today approx 1.5 hrs ago  Wound wrapped, unable to assess in triage

## 2022-01-05 NOTE — ED Notes (Signed)
Wound to RLE cleaned with dermal wound wash  Light guaze dressing applied

## 2022-01-05 NOTE — ED Provider Notes (Signed)
[facility] Provider Note   CSN: [id number] Arrival date & time: 01/05/22  2129     History  Chief Complaint  Patient presents with   Laceration    Karl Davis is a 61 y.o. male.  The history is provided by the patient.  Laceration Location: distal shin R. Length:  2 Depth:  Cutaneous Quality comment:  Upside down U Bleeding: controlled   Time since incident:  5 hours Injury mechanism: metal loppers. Pain details:    Quality:  Aching   Severity:  Mild   Timing:  Constant   Progression:  Unchanged Foreign body present:  No foreign bodies Relieved by:  Nothing Worsened by:  Nothing Tetanus status: given in triage. Associated symptoms: no fever        Home Medications Prior to Admission medications   Medication Sig Start Date End Date Taking? Authorizing Provider  buPROPion (WELLBUTRIN SR) 150 MG 12 hr tablet TAKE 1 TABLET BY MOUTH DAILY FOR 3 DAYS. INCREASE TO 1 TABLET BY MOUTH 2 TIMES DAILY 11/21/21   Jeanie Sewer, NP  famotidine (PEPCID) 20 MG tablet 20 mg  prn as needed 01/22/08   [provider]  HYDROcodone-acetaminophen (NORCO/VICODIN) 5-325 MG tablet Take 1-2 tablets by mouth every 6 (six) hours as needed for moderate pain. 10/17/21   Marybelle Killings, MD  ibuprofen (ADVIL) 200 MG tablet 200 mg  prn as needed 01/22/08   [provider]  pantoprazole (PROTONIX) 40 MG tablet Take 1 tablet (40 mg total) by mouth daily. 11/21/21   Mauri Pole, MD      Allergies    Patient has no known allergies.    Review of Systems   Review of Systems  Constitutional:  Negative for fever.  HENT:  Negative for facial swelling.   Eyes:  Negative for redness.  Respiratory:  Negative for wheezing and stridor.   Gastrointestinal:  Negative for abdominal pain.  Neurological:  Negative for facial asymmetry.  All other systems reviewed and are negative.   Physical Exam Updated Vital Signs BP (!) 148/92 (BP  Location: Right Arm)   Pulse 78   Temp 98.6 F (37 C) (Oral)   Resp 18   Ht '5\' 10"'$  (1.778 m)   Wt 83 kg   SpO2 99%   BMI 26.26 kg/m  Physical Exam Vitals and nursing note reviewed.  Constitutional:      General: He is not in acute distress.    Appearance: He is well-developed. He is not diaphoretic.  HENT:     Head: Normocephalic and atraumatic.     Nose: Nose normal.  Eyes:     Conjunctiva/sclera: Conjunctivae normal.     Pupils: Pupils are equal, round, and reactive to light.  Cardiovascular:     Rate and Rhythm: Normal rate and regular rhythm.     Pulses: Normal pulses.     Heart sounds: Normal heart sounds.  Pulmonary:     Effort: Pulmonary effort is normal.     Breath sounds: Normal breath sounds. No wheezing or rales.  Abdominal:     General: Abdomen is flat. Bowel sounds are normal.     Palpations: Abdomen is soft.     Tenderness: There is no abdominal tenderness. There is no guarding or rebound.  Musculoskeletal:        General: Normal range of motion.     Cervical back: Normal range of motion and neck supple.  Skin:    General: Skin is  warm and dry.     Capillary Refill: Capillary refill takes less than 2 seconds.       Neurological:     General: No focal deficit present.     Mental Status: He is alert and oriented to person, place, and time.  Psychiatric:        Mood and Affect: Mood normal.        Behavior: Behavior normal.     ED Results / Procedures / Treatments   Labs (all labs ordered are listed, but only abnormal results are displayed) Labs Reviewed - No data to display  EKG None  Radiology DG Tibia/Fibula Right  Result Date: 01/05/2022 CLINICAL DATA:  Wound.  Laceration. EXAM: RIGHT TIBIA AND FIBULA - 2 VIEW COMPARISON:  None Available. FINDINGS: There is no evidence of fracture or other focal bone lesions. There is no significant soft tissue swelling. There is a linear soft tissue density anterior to the proximal tibia measuring 3 mm,  indeterminate for foreign body. IMPRESSION: 1. Small linear density in the soft tissues anterior to the proximal tibia. Correlate clinically for foreign body. 2. No acute bony abnormality. Electronically Signed   By: Ronney Asters M.D.   On: 01/05/2022 23:03    Procedures .Marland KitchenLaceration Repair  Date/Time: 01/05/2022 11:36 PM  Performed by: Veatrice Kells, MD Authorized by: Veatrice Kells, MD   Consent:    Consent obtained:  Verbal   Consent given by:  Patient   Risks discussed:  Infection, need for additional repair and nerve damage   Alternatives discussed:  No treatment Universal protocol:    Patient identity confirmed:  Arm band Anesthesia:    Anesthesia method:  Topical application   Topical anesthetic:  LET Laceration details:    Location: distal r shin.   Length (cm):  2   Depth (mm):  1 Pre-procedure details:    Preparation:  Patient was prepped and draped in usual sterile fashion Exploration:    Hemostasis achieved with:  Direct pressure   Imaging outcome: foreign body not noted     Wound exploration: wound explored through full range of motion     Wound extent: no fascia violation noted   Treatment:    Area cleansed with:  Povidone-iodine, chlorhexidine and saline   Amount of cleaning:  Extensive   Irrigation solution:  Sterile saline   Irrigation method:  Syringe   Debridement:  None   Undermining:  None Skin repair:    Repair method:  Staples   Number of staples:  5 Approximation:    Approximation:  Close Repair type:    Repair type:  Simple Post-procedure details:    Dressing:  Sterile dressing   Procedure completion:  Tolerated well, no immediate complications     Medications Ordered in ED Medications  Tdap (BOOSTRIX) injection 0.5 mL (0.5 mLs Intramuscular Given 01/05/22 2253)  lidocaine-EPINEPHrine-tetracaine (LET) topical gel (3 mLs Topical Given 01/05/22 2305)    ED Course/ Medical Decision Making/ A&P                           Medical Decision  Making Laceration to the distal shin   Amount and/or Complexity of Data Reviewed Independent Historian: spouse    Details: see above External Data Reviewed: notes.    Details: previous notes reviewed Radiology: ordered and independent interpretation performed.    Details: no FB in the wound  Risk Risk Details: I have seen and appreciate radiology note but there  is no foreign body at the site of the laceration.  There is no laceration or wound where radiology reports the questions FB.  Staple removal in 12 days no submersion in any body of water for 2 weeks.  Patient and wife verbalize understanding and agree to follow up.      Final Clinical Impression(s) / ED Diagnoses Final diagnoses:  None  Return for intractable cough, coughing up blood, fevers > 100.4 unrelieved by medication, shortness of breath, intractable vomiting, chest pain, shortness of breath, weakness, numbness, changes in speech, facial asymmetry, abdominal pain, passing out, Inability to tolerate liquids or food, cough, altered mental status or any concerns. No signs of systemic illness or infection. The patient is nontoxic-appearing on exam and vital signs are within normal limits.  I have reviewed the triage vital signs and the nursing notes. Pertinent labs & imaging results that were available during my care of the patient were reviewed by me and considered in my medical decision making (see chart for details). After history, exam, and medical workup I feel the patient has been appropriately medically screened and is safe for discharge home. Pertinent diagnoses were discussed with the patient. Patient was given return precautions.   Rx / DC Orders ED Discharge Orders     None         Michaelah Credeur, MD 01/05/22 2339

## 2022-01-05 NOTE — ED Notes (Signed)
ED Provider at bedside. 

## 2022-01-05 NOTE — ED Provider Triage Note (Signed)
Emergency Medicine Provider Triage Evaluation Note  Karl Davis , a 61 y.o. male  was evaluated in triage.  Pt complains of right shin injury. Pt reports around 8-9 pm this evening he was using tree loppers and accidentially struck his right shin with the bladed end. Last tetanus was 9 yrs ago. Did clean wound with bactine pta. Bleeding controlled with direct pressure. No other injuries .  Review of Systems  Positive: Wound right shin Negative: No head injury, no cp  Physical Exam  BP (!) 148/92 (BP Location: Right Arm)   Pulse 78   Temp 98.6 F (37 C) (Oral)   Resp 18   Ht '5\' 10"'$  (1.778 m)   Wt 83 kg   SpO2 99%   BMI 26.26 kg/m  Gen:   Awake, no distress   Resp:  Normal effort  MSK:   Moves extremities without difficulty  Other:  Wound to right shin w/ bleeding controlled  Medical Decision Making  Medically screening exam initiated at 10:50 PM.  Appropriate orders placed.  Adelfo Diebel was informed that the remainder of the evaluation will be completed by another provider, this initial triage assessment does not replace that evaluation, and the importance of remaining in the ED until their evaluation is complete.  Give boostrix, xr for fb eval, wound care ordered.    Jeanell Sparrow, DO 01/05/22 2251

## 2022-01-05 NOTE — ED Notes (Signed)
Written and verbal inst to pt  Verbalized an understanding  To home with family  

## 2022-01-18 ENCOUNTER — Ambulatory Visit
Admission: EM | Admit: 2022-01-18 | Discharge: 2022-01-18 | Disposition: A | Discharge status: Home / Self Care | Payer: BC Managed Care – PPO | Attending: Emergency Medicine | Admitting: Emergency Medicine

## 2022-01-18 ENCOUNTER — Encounter: Payer: Self-pay | Admitting: Emergency Medicine

## 2022-01-18 DIAGNOSIS — L03115 Cellulitis of right lower limb: Secondary | ICD-10-CM | POA: Diagnosis not present

## 2022-01-18 DIAGNOSIS — T148XXA Other injury of unspecified body region, initial encounter: Secondary | ICD-10-CM

## 2022-01-18 DIAGNOSIS — L089 Local infection of the skin and subcutaneous tissue, unspecified: Secondary | ICD-10-CM

## 2022-01-18 MED ORDER — SULFAMETHOXAZOLE-TRIMETHOPRIM 800-160 MG PO TABS: 1.0000 | ORAL_TABLET | Freq: Two times a day (BID) | ORAL | 0 refills | Status: DC

## 2022-01-18 NOTE — Discharge Instructions (Signed)
Please keep your wound covered for the next 24 hours.  Please begin Bactrim 1 tablet twice daily for the next 5 days.  If you see any increase in the redness or streaking or you feel that the wound is not healing well, please feel free to return in 5 days for repeat evaluation.

## 2022-01-18 NOTE — ED Triage Notes (Addendum)
Here for staple removal from right lower leg. Wound edges not well approximated, appears as though wound may have separated at earlier stages of healing, with scab formation entrapping the edges of the staples. Reddened borders of surrounding wound edges extends out about 1 cm from wound circumference. Patient c/o new foot/ankle swelling starting yesterday. No drainage visible from wound, appears dry, staples intact but partially hanging from wound/not apposed to skin. Will notify provider to assess wound prior to removal of staples. Patient reports to having taken tylenol and ibuprofen around 6 am this morning.

## 2022-01-18 NOTE — ED Provider Notes (Signed)
[facility]    CSN: [id number] Arrival date & time: 01/18/22  0174    HISTORY   Chief Complaint  Patient presents with   Suture / Staple Removal   HPI Karl Davis is a pleasant, 60 y.o. male who presents to urgent care today complaining of Needing 5 staples removed from the wound on the anterior aspect of his right lower leg.  Patient states that the staples were placed 5 days ago at the emergency room.  Patient denies visible drainage from the wound which appears dry.  Staples are intact but there is surrounding erythema, particularly where staples penetrate the skin.  Patient states the area is also tender to palpation which started yesterday.  Patient states he is also noticed that the area is little swollen.  The history is provided by the patient.   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   Rt side   SPINE SURGERY  09/2001   Neck surgery C4/C5    Home Medications    Prior to Admission medications   Medication Sig Start Date End Date Taking? Authorizing Provider  sulfamethoxazole-trimethoprim (BACTRIM DS) 800-160 MG tablet Take 1 tablet by mouth 2 (two) times daily for 5 days. 01/18/22 01/23/22 Yes Lynden Oxford Scales, PA-C  buPROPion (WELLBUTRIN SR) 150  MG 12 hr tablet TAKE 1 TABLET BY MOUTH DAILY FOR 3 DAYS. INCREASE TO 1 TABLET BY MOUTH 2 TIMES DAILY 11/21/21   Jeanie Sewer, NP  famotidine (PEPCID) 20 MG tablet 20 mg  prn as needed 01/22/08   [provider]  HYDROcodone-acetaminophen (NORCO/VICODIN) 5-325 MG tablet Take 1-2 tablets by mouth every 6 (six) hours as needed for moderate pain. 10/17/21   Marybelle Killings, MD  ibuprofen (ADVIL) 200 MG tablet 200 mg  prn as needed 01/22/08   [provider]  pantoprazole (PROTONIX) 40 MG tablet Take 1 tablet (40 mg total) by mouth daily. 11/21/21   Mauri Pole, MD    Family History Family History  Problem Relation Age of Onset   Depression Mother    Diabetes Mother    Prostate cancer Father    Depression Sister    Depression Sister    ADD / ADHD Son    Social History Social History   Tobacco Use   Smoking status: Former    Types: Cigars    Passive exposure: Yes   Smokeless tobacco: Former  Scientific laboratory technician Use: Never used  Substance Use Topics   Alcohol use: Not Currently   Drug use: Not Currently    Types: Amphetamines, "Crack" cocaine, Marijuana, LSD   Allergies   Patient has no known allergies.  Review of Systems Review of Systems Pertinent findings revealed after performing a 14 point review of systems has been noted in  the history of present illness.  Physical Exam Triage Vital Signs ED Triage Vitals  Enc Vitals Group     BP 05/06/21 0827 (!) 147/82     Pulse Rate 05/06/21 0827 72     Resp 05/06/21 0827 18     Temp 05/06/21 0827 98.3 F (36.8 C)     Temp Source 05/06/21 0827 Oral     SpO2 05/06/21 0827 98 %     Weight --      Height --      Head Circumference --      Peak Flow --      Pain Score 05/06/21 0826 5     Pain Loc --      Pain Edu? --      Excl. in [place]? --   No data found.  Updated Vital Signs BP 131/85 (BP Location: Right Arm)   Pulse 79   Temp 98.3 F (36.8 C) (Oral)   Resp 16   SpO2 96%   Physical  Exam Vitals and nursing note reviewed.  Constitutional:      General: He is not in acute distress.    Appearance: Normal appearance. He is normal weight. He is not ill-appearing.  HENT:     Head: Normocephalic and atraumatic.  Eyes:     Extraocular Movements: Extraocular movements intact.     Conjunctiva/sclera: Conjunctivae normal.     Pupils: Pupils are equal, round, and reactive to light.  Cardiovascular:     Rate and Rhythm: Normal rate and regular rhythm.  Pulmonary:     Effort: Pulmonary effort is normal.     Breath sounds: Normal breath sounds.  Musculoskeletal:        General: Normal range of motion.     Cervical back: Normal range of motion and neck supple.  Skin:    General: Skin is warm and dry.     Findings: Lesion (Wound edges are not well approximated, significant amount of eschar and wound.  Erythema extends 3 to 4 cm in diameter around the wound.) present.  Neurological:     General: No focal deficit present.     Mental Status: He is alert and oriented to person, place, and time. Mental status is at baseline.  Psychiatric:        Mood and Affect: Mood normal.        Behavior: Behavior normal.        Thought Content: Thought content normal.        Judgment: Judgment normal.     Visual Acuity Right Eye Distance:   Left Eye Distance:   Bilateral Distance:    Right Eye Near:   Left Eye Near:    Bilateral Near:     UC Couse / Diagnostics / Procedures:     Radiology No results found.  Procedures Procedures (including critical care time) EKG  Pending results:  Labs Reviewed - No data to display  Medications Ordered in UC: Medications - No data to display  UC Diagnoses / Final Clinical Impressions(s)   I have reviewed the triage vital signs and the nursing notes.  Pertinent labs & imaging results that were available during my care of the patient were reviewed by me and considered in my medical decision making (see chart for details).    Final  diagnoses:  Cellulitis of leg, right  Wound infection, posttraumatic   Staples were removed without difficulty, patient tolerated well.  Wound is not dehisced but there is still significant amount of  eschar and surrounding erythema concerning for cellulitis.  Patient provided with prescription for Bactrim, advised to keep the leg elevated, keep the wound covered for 24 hours then remove.  Return precautions advised.  ED Prescriptions     Medication Sig Dispense Auth. Provider   sulfamethoxazole-trimethoprim (BACTRIM DS) 800-160 MG tablet Take 1 tablet by mouth 2 (two) times daily for 5 days. 10 tablet Lynden Oxford Scales, PA-C      PDMP not reviewed this encounter.  Pending results:  Labs Reviewed - No data to display  Discharge Instructions:   Discharge Instructions      Please keep your wound covered for the next 24 hours.  Please begin Bactrim 1 tablet twice daily for the next 5 days.  If you see any increase in the redness or streaking or you feel that the wound is not healing well, please feel free to return in 5 days for repeat evaluation.    Disposition Upon Discharge:  Condition: stable for discharge home  Patient presented with an acute illness with associated systemic symptoms and significant discomfort requiring urgent management. In my opinion, this is a condition that a prudent lay person (someone who possesses an average knowledge of health and medicine) may potentially expect to result in complications if not addressed urgently such as respiratory distress, impairment of bodily function or dysfunction of bodily organs.   Routine symptom specific, illness specific and/or disease specific instructions were discussed with the patient and/or caregiver at length.   As such, the patient has been evaluated and assessed, work-up was performed and treatment was provided in alignment with urgent care protocols and evidence based medicine.  Patient/parent/caregiver has been  advised that the patient may require follow up for further testing and treatment if the symptoms continue in spite of treatment, as clinically indicated and appropriate.  Patient/parent/caregiver has been advised to return to the [facility] or PCP if no better; to PCP or the Emergency Department if new signs and symptoms develop, or if the current signs or symptoms continue to change or worsen for further workup, evaluation and treatment as clinically indicated and appropriate  The patient will follow up with their current PCP if and as advised. If the patient does not currently have a PCP we will assist them in obtaining one.   The patient may need specialty follow up if the symptoms continue, in spite of conservative treatment and management, for further workup, evaluation, consultation and treatment as clinically indicated and appropriate.   Patient/parent/caregiver verbalized understanding and agreement of plan as discussed.  All questions were addressed during visit.  Please see discharge instructions below for further details of plan.  This office note has been dictated using Museum/gallery curator.  Unfortunately, this method of dictation can sometimes lead to typographical or grammatical errors.  I apologize for your inconvenience in advance if this occurs.  Please do not hesitate to reach out to me if clarification is needed.      Lynden Oxford Scales, [place] 01/18/22 [phone]

## 2022-01-19 ENCOUNTER — Ambulatory Visit
Admission: EM | Admit: 2022-01-19 | Discharge: 2022-01-19 | Disposition: A | Discharge status: Home / Self Care | Payer: BC Managed Care – PPO | Attending: Physician Assistant | Admitting: Physician Assistant

## 2022-01-19 DIAGNOSIS — L03115 Cellulitis of right lower limb: Secondary | ICD-10-CM | POA: Diagnosis not present

## 2022-01-19 MED ORDER — CEFTRIAXONE SODIUM 1 G IJ SOLR
1.0000 g | Freq: Once | INTRAMUSCULAR | Status: AC
  Administered 2022-01-19: 1 g via INTRAMUSCULAR

## 2022-01-19 NOTE — ED Triage Notes (Signed)
Pt states was here yesterday and had a staple removed from RLE and placed on antibiotics d/t redness. States now having swelling and pain to RLE and foot.

## 2022-01-19 NOTE — Discharge Instructions (Signed)
Soak area 20 minutes 4 times a day.  Continue antibiotics

## 2022-01-22 ENCOUNTER — Inpatient Hospital Stay (HOSPITAL_COMMUNITY)
Admission: EM | Admit: 2022-01-22 | Discharge: 2022-01-26 | DRG: 913 | Disposition: A | Discharge status: Home / Self Care | Payer: BC Managed Care – PPO | Attending: General Surgery | Admitting: General Surgery

## 2022-01-22 ENCOUNTER — Encounter (HOSPITAL_COMMUNITY): Payer: Self-pay | Admitting: Emergency Medicine

## 2022-01-22 ENCOUNTER — Other Ambulatory Visit: Payer: Self-pay

## 2022-01-22 ENCOUNTER — Emergency Department (HOSPITAL_COMMUNITY): Payer: BC Managed Care – PPO

## 2022-01-22 DIAGNOSIS — K219 Gastro-esophageal reflux disease without esophagitis: Secondary | ICD-10-CM | POA: Diagnosis present

## 2022-01-22 DIAGNOSIS — M25571 Pain in right ankle and joints of right foot: Secondary | ICD-10-CM | POA: Diagnosis not present

## 2022-01-22 DIAGNOSIS — S32040A Wedge compression fracture of fourth lumbar vertebra, initial encounter for closed fracture: Secondary | ICD-10-CM | POA: Diagnosis present

## 2022-01-22 DIAGNOSIS — S0121XA Laceration without foreign body of nose, initial encounter: Secondary | ICD-10-CM | POA: Diagnosis present

## 2022-01-22 DIAGNOSIS — Y907 Blood alcohol level of 200-239 mg/100 ml: Secondary | ICD-10-CM | POA: Diagnosis present

## 2022-01-22 DIAGNOSIS — Z87891 Personal history of nicotine dependence: Secondary | ICD-10-CM

## 2022-01-22 DIAGNOSIS — E875 Hyperkalemia: Secondary | ICD-10-CM | POA: Diagnosis present

## 2022-01-22 DIAGNOSIS — S065X0A Traumatic subdural hemorrhage without loss of consciousness, initial encounter: Secondary | ICD-10-CM | POA: Diagnosis present

## 2022-01-22 DIAGNOSIS — S2221XA Fracture of manubrium, initial encounter for closed fracture: Secondary | ICD-10-CM | POA: Diagnosis present

## 2022-01-22 DIAGNOSIS — S15192A Other specified injury of left vertebral artery, initial encounter: Principal | ICD-10-CM | POA: Diagnosis present

## 2022-01-22 DIAGNOSIS — S066X0A Traumatic subarachnoid hemorrhage without loss of consciousness, initial encounter: Secondary | ICD-10-CM | POA: Diagnosis present

## 2022-01-22 DIAGNOSIS — I7774 Dissection of vertebral artery: Secondary | ICD-10-CM | POA: Diagnosis present

## 2022-01-22 DIAGNOSIS — Z79899 Other long term (current) drug therapy: Secondary | ICD-10-CM

## 2022-01-22 DIAGNOSIS — S065XAA Traumatic subdural hemorrhage with loss of consciousness status unknown, initial encounter: Secondary | ICD-10-CM

## 2022-01-22 DIAGNOSIS — Z602 Problems related to living alone: Secondary | ICD-10-CM | POA: Diagnosis present

## 2022-01-22 DIAGNOSIS — F10129 Alcohol abuse with intoxication, unspecified: Secondary | ICD-10-CM | POA: Diagnosis present

## 2022-01-22 DIAGNOSIS — Y92410 Unspecified street and highway as the place of occurrence of the external cause: Secondary | ICD-10-CM

## 2022-01-22 DIAGNOSIS — I609 Nontraumatic subarachnoid hemorrhage, unspecified: Principal | ICD-10-CM

## 2022-01-22 DIAGNOSIS — M1990 Unspecified osteoarthritis, unspecified site: Secondary | ICD-10-CM | POA: Diagnosis present

## 2022-01-22 DIAGNOSIS — S12201A Unspecified nondisplaced fracture of third cervical vertebra, initial encounter for closed fracture: Secondary | ICD-10-CM | POA: Diagnosis present
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
## 2022-01-23 ENCOUNTER — Emergency Department (HOSPITAL_COMMUNITY): Payer: BC Managed Care – PPO

## 2022-01-23 DIAGNOSIS — S065X0A Traumatic subdural hemorrhage without loss of consciousness, initial encounter: Secondary | ICD-10-CM | POA: Diagnosis present

## 2022-01-23 DIAGNOSIS — E875 Hyperkalemia: Secondary | ICD-10-CM | POA: Diagnosis present

## 2022-01-23 DIAGNOSIS — S12201A Unspecified nondisplaced fracture of third cervical vertebra, initial encounter for closed fracture: Secondary | ICD-10-CM | POA: Diagnosis present

## 2022-01-23 DIAGNOSIS — F10129 Alcohol abuse with intoxication, unspecified: Secondary | ICD-10-CM | POA: Diagnosis present

## 2022-01-23 DIAGNOSIS — S2221XA Fracture of manubrium, initial encounter for closed fracture: Secondary | ICD-10-CM | POA: Diagnosis present

## 2022-01-23 DIAGNOSIS — S15192A Other specified injury of left vertebral artery, initial encounter: Secondary | ICD-10-CM | POA: Diagnosis present

## 2022-01-23 DIAGNOSIS — Z602 Problems related to living alone: Secondary | ICD-10-CM | POA: Diagnosis present

## 2022-01-23 DIAGNOSIS — Z79899 Other long term (current) drug therapy: Secondary | ICD-10-CM | POA: Diagnosis not present

## 2022-01-23 DIAGNOSIS — K219 Gastro-esophageal reflux disease without esophagitis: Secondary | ICD-10-CM | POA: Diagnosis present

## 2022-01-23 DIAGNOSIS — Y907 Blood alcohol level of 200-239 mg/100 ml: Secondary | ICD-10-CM | POA: Diagnosis present

## 2022-01-23 DIAGNOSIS — I7774 Dissection of vertebral artery: Secondary | ICD-10-CM | POA: Diagnosis present

## 2022-01-23 DIAGNOSIS — S066X0A Traumatic subarachnoid hemorrhage without loss of consciousness, initial encounter: Secondary | ICD-10-CM | POA: Diagnosis present

## 2022-01-23 DIAGNOSIS — S32040A Wedge compression fracture of fourth lumbar vertebra, initial encounter for closed fracture: Secondary | ICD-10-CM | POA: Diagnosis present

## 2022-01-23 DIAGNOSIS — Y92410 Unspecified street and highway as the place of occurrence of the external cause: Secondary | ICD-10-CM | POA: Diagnosis not present

## 2022-01-23 DIAGNOSIS — S0121XA Laceration without foreign body of nose, initial encounter: Secondary | ICD-10-CM | POA: Diagnosis present

## 2022-01-23 DIAGNOSIS — Z87891 Personal history of nicotine dependence: Secondary | ICD-10-CM | POA: Diagnosis not present

## 2022-01-23 DIAGNOSIS — M25571 Pain in right ankle and joints of right foot: Secondary | ICD-10-CM | POA: Diagnosis not present

## 2022-01-23 DIAGNOSIS — M1990 Unspecified osteoarthritis, unspecified site: Secondary | ICD-10-CM | POA: Diagnosis present

## 2022-01-23 LAB — HIV ANTIBODY (ROUTINE TESTING W REFLEX): HIV Screen 4th Generation wRfx: NONREACTIVE

## 2022-01-23 LAB — CBC
HCT: 47.7 % (ref 39.0–52.0)
Hemoglobin: 16.1 g/dL (ref 13.0–17.0)
MCH: 31.3 pg (ref 26.0–34.0)
MCHC: 33.8 g/dL (ref 30.0–36.0)
MCV: 92.8 fL (ref 80.0–100.0)
Platelets: 229 10*3/uL (ref 150–400)
RBC: 5.14 MIL/uL (ref 4.22–5.81)
RDW: 12.2 % (ref 11.5–15.5)
WBC: 7.7 10*3/uL (ref 4.0–10.5)
nRBC: 0 % (ref 0.0–0.2)

## 2022-01-23 LAB — COMPREHENSIVE METABOLIC PANEL
ALT: 50 U/L — ABNORMAL HIGH (ref 0–44)
AST: 67 U/L — ABNORMAL HIGH (ref 15–41)
Albumin: 4.7 g/dL (ref 3.5–5.0)
Alkaline Phosphatase: 56 U/L (ref 38–126)
Anion gap: 15 (ref 5–15)
BUN: 12 mg/dL (ref 8–23)
CO2: 16 mmol/L — ABNORMAL LOW (ref 22–32)
Calcium: 9.1 mg/dL (ref 8.9–10.3)
Chloride: 104 mmol/L (ref 98–111)
Creatinine, Ser: 1.05 mg/dL (ref 0.61–1.24)
GFR, Estimated: >60 mL/min (ref >60)
Glucose, Bld: 131 mg/dL — ABNORMAL HIGH (ref 70–99)
Potassium: 4.2 mmol/L (ref 3.5–5.1)
Sodium: 135 mmol/L (ref 135–145)
Total Bilirubin: 0.4 mg/dL (ref 0.3–1.2)
Total Protein: 8.1 g/dL (ref 6.5–8.1)

## 2022-01-23 LAB — ETHANOL: Alcohol, Ethyl (B): 235 mg/dL — ABNORMAL HIGH (ref <10)

## 2022-01-23 LAB — MRSA NEXT GEN BY PCR, NASAL: MRSA by PCR Next Gen: NOT DETECTED

## 2022-01-23 MED ORDER — MORPHINE SULFATE (PF) 2 MG/ML IV SOLN
2.0000 mg | INTRAVENOUS | Status: DC | PRN
  Administered 2022-01-23 – 2022-01-26 (×11): 2 mg via INTRAVENOUS
  Filled 2022-01-23 (×11): qty 1

## 2022-01-23 MED ORDER — THIAMINE HCL 100 MG PO TABS
100.0000 mg | ORAL_TABLET | Freq: Every day | ORAL | Status: DC
  Administered 2022-01-23 – 2022-01-26 (×4): 100 mg via ORAL
  Filled 2022-01-23 (×4): qty 1

## 2022-01-23 MED ORDER — DOCUSATE SODIUM 100 MG PO CAPS
100.0000 mg | ORAL_CAPSULE | Freq: Two times a day (BID) | ORAL | Status: DC
Start: 2022-01-23 — End: 2022-01-26
  Administered 2022-01-23 – 2022-01-26 (×7): 100 mg via ORAL
  Filled 2022-01-23 (×7): qty 1

## 2022-01-23 MED ORDER — ONDANSETRON HCL 4 MG/2ML IJ SOLN
4.0000 mg | Freq: Once | INTRAMUSCULAR | Status: AC
  Administered 2022-01-23: 4 mg via INTRAVENOUS
  Filled 2022-01-23: qty 2

## 2022-01-23 MED ORDER — ACETAMINOPHEN 500 MG PO TABS
1000.0000 mg | ORAL_TABLET | Freq: Four times a day (QID) | ORAL | Status: DC
  Administered 2022-01-23 – 2022-01-26 (×13): 1000 mg via ORAL
  Filled 2022-01-23 (×14): qty 2

## 2022-01-23 MED ORDER — METHOCARBAMOL 500 MG PO TABS
1000.0000 mg | ORAL_TABLET | Freq: Three times a day (TID) | ORAL | Status: DC
  Administered 2022-01-23 – 2022-01-26 (×9): 1000 mg via ORAL
  Filled 2022-01-23 (×9): qty 2

## 2022-01-23 MED ORDER — OXYCODONE HCL 5 MG/5ML PO SOLN: 5.0000 mg | ORAL | Status: DC | PRN

## 2022-01-23 MED ORDER — ADULT MULTIVITAMIN W/MINERALS CH
1.0000 | ORAL_TABLET | Freq: Every day | ORAL | Status: DC
  Administered 2022-01-23 – 2022-01-26 (×4): 1 via ORAL
  Filled 2022-01-23 (×4): qty 1

## 2022-01-23 MED ORDER — ONDANSETRON 4 MG PO TBDP
4.0000 mg | ORAL_TABLET | Freq: Four times a day (QID) | ORAL | Status: DC | PRN
  Administered 2022-01-23 – 2022-01-24 (×2): 4 mg via ORAL
  Filled 2022-01-23 (×2): qty 1

## 2022-01-23 MED ORDER — METOPROLOL TARTRATE 5 MG/5ML IV SOLN: 5.0000 mg | Freq: Four times a day (QID) | INTRAVENOUS | Status: DC | PRN

## 2022-01-23 MED ORDER — HYDROMORPHONE HCL 1 MG/ML IJ SOLN
1.0000 mg | Freq: Once | INTRAMUSCULAR | Status: AC
  Administered 2022-01-23: 1 mg via INTRAVENOUS
  Filled 2022-01-23: qty 1

## 2022-01-23 MED ORDER — FOLIC ACID 1 MG PO TABS
1.0000 mg | ORAL_TABLET | Freq: Every day | ORAL | Status: DC
  Administered 2022-01-23 – 2022-01-26 (×4): 1 mg via ORAL
  Filled 2022-01-23 (×4): qty 1

## 2022-01-23 MED ORDER — ONDANSETRON HCL 4 MG/2ML IJ SOLN: 4.0000 mg | Freq: Four times a day (QID) | INTRAMUSCULAR | Status: DC | PRN

## 2022-01-23 MED ORDER — LEVETIRACETAM IN NACL 500 MG/100ML IV SOLN
500.0000 mg | Freq: Two times a day (BID) | INTRAVENOUS | Status: DC
  Administered 2022-01-23 – 2022-01-24 (×3): 500 mg via INTRAVENOUS
  Filled 2022-01-23 (×4): qty 100

## 2022-01-23 MED ORDER — CHLORHEXIDINE GLUCONATE CLOTH 2 % EX PADS
6.0000 | MEDICATED_PAD | Freq: Every day | CUTANEOUS | Status: DC
  Administered 2022-01-23 – 2022-01-24 (×2): 6 via TOPICAL

## 2022-01-23 MED ORDER — OXYCODONE HCL 5 MG PO TABS
5.0000 mg | ORAL_TABLET | ORAL | Status: DC | PRN
  Administered 2022-01-23 – 2022-01-25 (×10): 10 mg via ORAL
  Administered 2022-01-25: 5 mg via ORAL
  Administered 2022-01-25 – 2022-01-26 (×2): 10 mg via ORAL
  Filled 2022-01-23 (×14): qty 2

## 2022-01-23 MED ORDER — ACETAMINOPHEN 325 MG PO TABS
650.0000 mg | ORAL_TABLET | ORAL | Status: DC | PRN
Start: 2022-01-23 — End: 2022-01-23

## 2022-01-23 MED ORDER — IOHEXOL 350 MG/ML SOLN
100.0000 mL | Freq: Once | INTRAVENOUS | Status: AC | PRN
  Administered 2022-01-23: 100 mL via INTRAVENOUS
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
## 2022-01-23 NOTE — Progress Notes (Signed)
Orthopedic Tech Progress Note Patient Details:  Karl Davis 10/28/1960 [id number]  Ortho Devices Type of Ortho Device: Lumbar corsett Ortho Device/Splint Location: BACK Ortho Device/Splint Interventions: Ordered, Adjustment   Post Interventions Patient Tolerated: Well Instructions Provided: Care of device  Janit Pagan 01/23/2022, 9:15 AM

## 2022-01-23 NOTE — ED Notes (Signed)
[place] J collar applied , traction held while applying collar Ferrel Logan .

## 2022-01-23 NOTE — ED Notes (Signed)
Patient transported to CT 
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
## 2022-01-24 ENCOUNTER — Encounter (HOSPITAL_COMMUNITY): Payer: Self-pay

## 2022-01-24 LAB — BASIC METABOLIC PANEL
Anion gap: 15 (ref 5–15)
BUN: 16 mg/dL (ref 8–23)
CO2: 27 mmol/L (ref 22–32)
Calcium: 9.4 mg/dL (ref 8.9–10.3)
Chloride: 97 mmol/L — ABNORMAL LOW (ref 98–111)
Creatinine, Ser: 1.2 mg/dL (ref 0.61–1.24)
GFR, Estimated: >60 mL/min (ref >60)
Glucose, Bld: 110 mg/dL — ABNORMAL HIGH (ref 70–99)
Potassium: 5.4 mmol/L — ABNORMAL HIGH (ref 3.5–5.1)
Sodium: 139 mmol/L (ref 135–145)

## 2022-01-24 LAB — CBC
HCT: 43.1 % (ref 39.0–52.0)
Hemoglobin: 14.7 g/dL (ref 13.0–17.0)
MCH: 31.7 pg (ref 26.0–34.0)
MCHC: 34.1 g/dL (ref 30.0–36.0)
MCV: 92.9 fL (ref 80.0–100.0)
Platelets: 159 10*3/uL (ref 150–400)
RBC: 4.64 MIL/uL (ref 4.22–5.81)
RDW: 12.4 % (ref 11.5–15.5)
WBC: 8 10*3/uL (ref 4.0–10.5)
nRBC: 0 % (ref 0.0–0.2)

## 2022-01-24 MED ORDER — PANTOPRAZOLE SODIUM 40 MG PO TBEC
40.0000 mg | DELAYED_RELEASE_TABLET | Freq: Every day | ORAL | Status: DC
  Administered 2022-01-24 – 2022-01-25 (×2): 40 mg via ORAL
  Filled 2022-01-24 (×2): qty 1

## 2022-01-24 MED ORDER — ENOXAPARIN SODIUM 30 MG/0.3ML IJ SOSY
30.0000 mg | PREFILLED_SYRINGE | Freq: Two times a day (BID) | INTRAMUSCULAR | Status: DC
Start: 2022-01-24 — End: 2022-01-26
  Administered 2022-01-24 – 2022-01-25 (×3): 30 mg via SUBCUTANEOUS
  Filled 2022-01-24 (×5): qty 0.3

## 2022-01-24 MED ORDER — LEVETIRACETAM 500 MG PO TABS
500.0000 mg | ORAL_TABLET | Freq: Two times a day (BID) | ORAL | Status: DC
  Administered 2022-01-24 – 2022-01-26 (×4): 500 mg via ORAL
  Filled 2022-01-24 (×4): qty 1

## 2022-01-24 MED ORDER — BUPROPION HCL ER (SR) 150 MG PO TB12
150.0000 mg | ORAL_TABLET | Freq: Two times a day (BID) | ORAL | Status: DC
  Administered 2022-01-24 – 2022-01-26 (×5): 150 mg via ORAL
  Filled 2022-01-24 (×6): qty 1

## 2022-01-24 MED ORDER — ASPIRIN 81 MG PO TBEC
81.0000 mg | DELAYED_RELEASE_TABLET | Freq: Every day | ORAL | Status: DC
  Administered 2022-01-24 – 2022-01-26 (×3): 81 mg via ORAL
  Filled 2022-01-24 (×3): qty 1

## 2022-01-24 MED ORDER — SODIUM ZIRCONIUM CYCLOSILICATE 10 G PO PACK
10.0000 g | PACK | Freq: Once | ORAL | Status: AC
Start: 2022-01-24 — End: 2022-01-24
  Administered 2022-01-24: 10 g via ORAL
  Filled 2022-01-24: qty 1
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
## 2022-01-25 ENCOUNTER — Encounter (HOSPITAL_COMMUNITY): Payer: Self-pay

## 2022-01-25 ENCOUNTER — Inpatient Hospital Stay (HOSPITAL_COMMUNITY): Payer: BC Managed Care – PPO

## 2022-01-25 ENCOUNTER — Ambulatory Visit: Payer: BC Managed Care – PPO | Admitting: Gastroenterology

## 2022-01-25 LAB — COMPREHENSIVE METABOLIC PANEL
ALT: 46 U/L — ABNORMAL HIGH (ref 0–44)
AST: 62 U/L — ABNORMAL HIGH (ref 15–41)
Albumin: 3.6 g/dL (ref 3.5–5.0)
Alkaline Phosphatase: 45 U/L (ref 38–126)
Anion gap: 6 (ref 5–15)
BUN: 15 mg/dL (ref 8–23)
CO2: 27 mmol/L (ref 22–32)
Calcium: 9.2 mg/dL (ref 8.9–10.3)
Chloride: 105 mmol/L (ref 98–111)
Creatinine, Ser: 1.07 mg/dL (ref 0.61–1.24)
GFR, Estimated: >60 mL/min (ref >60)
Glucose, Bld: 109 mg/dL — ABNORMAL HIGH (ref 70–99)
Potassium: 4.8 mmol/L (ref 3.5–5.1)
Sodium: 138 mmol/L (ref 135–145)
Total Bilirubin: 1.1 mg/dL (ref 0.3–1.2)
Total Protein: 6.4 g/dL — ABNORMAL LOW (ref 6.5–8.1)

## 2022-01-25 MED ORDER — POLYVINYL ALCOHOL 1.4 % OP SOLN
1.0000 [drp] | OPHTHALMIC | Status: DC | PRN
  Administered 2022-01-25: 1 [drp] via OPHTHALMIC
  Filled 2022-01-25: qty 15
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
## 2022-01-25 NOTE — TOC CAGE-AID Note (Signed)
Transition of Care [facility]) - CAGE-AID Screening   Patient Details  Name: Karl Davis MRN: [id number] Date of Birth: 1961/03/22  [facility]) CM/SW Contact:    Trudee Kuster, RN Phone Number: 01/25/2022, 8:36 PM   Clinical Narrative: Patient states only drinks alcohol occasionally. Patient denies use of illicit drugs.   CAGE-AID Screening:    Have You Ever Felt You Ought to Cut Down on Your Drinking or Drug Use?: No Have People Annoyed You By Critizing Your Drinking Or Drug Use?: No Have You Felt Bad Or Guilty About Your Drinking Or Drug Use?: No Have You Ever Had a Drink or Used Drugs First Thing In The Morning to Steady Your Nerves or to Get Rid of a Hangover?: No CAGE-AID Score: 0  Substance Abuse Education Offered: No

## 2022-01-25 NOTE — Progress Notes (Signed)
Orthopedic Tech Progress Note Patient Details:  Karl Davis January 23, 1961 [id number]  Ortho Devices Type of Ortho Device: CAM walker Ortho Device/Splint Location: RLE Ortho Device/Splint Interventions: Ordered   Post Interventions Patient Tolerated: Well Instructions Provided: Adjustment of device, Care of device Patient stated he was going back to bed and did not want to wear the boot in bed. Boot left on the counter. [name] 01/25/2022, 4:55 PM
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
## 2022-01-25 NOTE — Progress Notes (Signed)
OT Cancellation Note  Patient Details Name: Karl Davis MRN: [id number] DOB: 08-10-1960   Cancelled Treatment:    Reason Eval/Treat Not Completed: Other (comment) Initiated OT eval though pt currently still working on breakfast tray. Will follow up  Layla Maw 01/25/2022, 9:15 AM

## 2022-01-26 LAB — BASIC METABOLIC PANEL
Anion gap: 10 (ref 5–15)
BUN: 18 mg/dL (ref 8–23)
CO2: 25 mmol/L (ref 22–32)
Calcium: 9 mg/dL (ref 8.9–10.3)
Chloride: 102 mmol/L (ref 98–111)
Creatinine, Ser: 0.98 mg/dL (ref 0.61–1.24)
GFR, Estimated: >60 mL/min (ref >60)
Glucose, Bld: 113 mg/dL — ABNORMAL HIGH (ref 70–99)
Potassium: 3.7 mmol/L (ref 3.5–5.1)
Sodium: 137 mmol/L (ref 135–145)

## 2022-01-26 MED ORDER — DOCUSATE SODIUM 100 MG PO CAPS: 100.0000 mg | ORAL_CAPSULE | Freq: Two times a day (BID) | ORAL | 0 refills | Status: DC

## 2022-01-26 MED ORDER — ACETAMINOPHEN 500 MG PO TABS
1000.0000 mg | ORAL_TABLET | Freq: Four times a day (QID) | ORAL | 0 refills | Status: AC
Start: 2022-01-26 — End: ?

## 2022-01-26 MED ORDER — LIDOCAINE 5 % EX PTCH: 1.0000 | MEDICATED_PATCH | CUTANEOUS | 1 refills | Status: DC

## 2022-01-26 MED ORDER — LIDOCAINE 5 % EX PTCH
1.0000 | MEDICATED_PATCH | CUTANEOUS | Status: DC
  Administered 2022-01-26: 1 via TRANSDERMAL
  Filled 2022-01-26: qty 1

## 2022-01-26 MED ORDER — OXYCODONE HCL 5 MG PO TABS
10.0000 mg | ORAL_TABLET | Freq: Four times a day (QID) | ORAL | 0 refills | Status: DC | PRN
Start: 2022-01-26 — End: 2022-02-01

## 2022-01-26 MED ORDER — ASPIRIN 81 MG PO TBEC: 81.0000 mg | DELAYED_RELEASE_TABLET | Freq: Every day | ORAL | 12 refills | Status: AC

## 2022-01-26 MED ORDER — LEVETIRACETAM 500 MG PO TABS: 500.0000 mg | ORAL_TABLET | Freq: Two times a day (BID) | ORAL | 0 refills | Status: DC

## 2022-01-26 MED ORDER — METHOCARBAMOL 1000 MG PO TABS: 1000.0000 mg | ORAL_TABLET | Freq: Three times a day (TID) | ORAL | 1 refills | Status: DC | PRN
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
## 2022-01-27 ENCOUNTER — Telehealth: Payer: Self-pay

## 2022-01-27 NOTE — Telephone Encounter (Signed)
Transition Care Management Follow-up Telephone Call Date of discharge and from where: [place] 01/26/22 How have you been since you were released from the hospital? [facility]  Any questions or concerns? No  Items Reviewed: Did the pt receive and understand the discharge instructions provided? Yes  Medications obtained and verified? Yes  Other? No  Any new allergies since your discharge? No  Dietary orders reviewed? Yes Do you have support at home? Yes   Home Care and Equipment/Supplies: Were home health services ordered? not applicable If so, what is the name of the agency?   Has the agency set up a time to come to the patient's home? not applicable Were any new equipment or medical supplies ordered?  No What is the name of the medical supply agency?  Were you able to get the supplies/equipment? not applicable Do you have any questions related to the use of the equipment or supplies? No  Functional Questionnaire: (I = Independent and D = Dependent) ADLs: I  Bathing/Dressing- I  Meal Prep- I  Eating- I  Maintaining continence- I  Transferring/Ambulation- I  Managing Meds- I  Follow up appointments reviewed:  PCP Hospital f/u appt confirmed? No   Specialist Hospital f/u appt confirmed? Yes  Scheduled to see ortho surgeon  Dr Lorin Mercy on 02/01/22 @ 8:30. Are transportation arrangements needed? No friends have been getting him to appt  If their condition worsens, is the pt aware to call PCP or go to the Emergency Dept.? Yes Was the patient provided with contact information for the PCP's office or ED? Yes Was to pt encouraged to call back with questions or concerns? Yes

## 2022-01-30 ENCOUNTER — Ambulatory Visit (HOSPITAL_COMMUNITY)
Admission: RE | Admit: 2022-01-30 | Discharge: 2022-01-30 | Disposition: A | Discharge status: Home / Self Care | Payer: BC Managed Care – PPO | Source: Ambulatory Visit | Attending: General Surgery | Admitting: General Surgery

## 2022-01-30 DIAGNOSIS — I7774 Dissection of vertebral artery: Secondary | ICD-10-CM | POA: Insufficient documentation

## 2022-01-30 DIAGNOSIS — S32040A Wedge compression fracture of fourth lumbar vertebra, initial encounter for closed fracture: Secondary | ICD-10-CM | POA: Insufficient documentation

## 2022-01-30 DIAGNOSIS — S2221XA Fracture of manubrium, initial encounter for closed fracture: Secondary | ICD-10-CM | POA: Insufficient documentation

## 2022-01-30 DIAGNOSIS — I609 Nontraumatic subarachnoid hemorrhage, unspecified: Secondary | ICD-10-CM | POA: Insufficient documentation

## 2022-01-30 DIAGNOSIS — S12201A Unspecified nondisplaced fracture of third cervical vertebra, initial encounter for closed fracture: Secondary | ICD-10-CM | POA: Diagnosis present

## 2022-01-30 MED ORDER — IOHEXOL 350 MG/ML SOLN
100.0000 mL | Freq: Once | INTRAVENOUS | Status: AC | PRN
  Administered 2022-01-30: 100 mL via INTRAVENOUS

## 2022-02-01 ENCOUNTER — Ambulatory Visit: Payer: BC Managed Care – PPO | Admitting: Orthopaedic Surgery

## 2022-02-01 VITALS — BP 154/97 | HR 62

## 2022-02-01 DIAGNOSIS — R1310 Dysphagia, unspecified: Secondary | ICD-10-CM | POA: Diagnosis not present

## 2022-02-01 DIAGNOSIS — G5603 Carpal tunnel syndrome, bilateral upper limbs: Secondary | ICD-10-CM | POA: Diagnosis not present

## 2022-02-01 DIAGNOSIS — S32048A Other fracture of fourth lumbar vertebra, initial encounter for closed fracture: Secondary | ICD-10-CM | POA: Diagnosis not present

## 2022-02-01 DIAGNOSIS — M4722 Other spondylosis with radiculopathy, cervical region: Secondary | ICD-10-CM | POA: Diagnosis not present

## 2022-02-01 DIAGNOSIS — S32049A Unspecified fracture of fourth lumbar vertebra, initial encounter for closed fracture: Secondary | ICD-10-CM | POA: Insufficient documentation

## 2022-02-01 HISTORY — DX: Dysphagia, unspecified: R13.10

## 2022-02-01 MED ORDER — OXYCODONE HCL 5 MG PO TABS
5.0000 mg | ORAL_TABLET | Freq: Four times a day (QID) | ORAL | 0 refills | Status: DC | PRN
Start: 2022-02-01 — End: 2022-02-17
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
## 2022-02-17 ENCOUNTER — Encounter: Payer: Self-pay | Admitting: Family

## 2022-02-17 ENCOUNTER — Other Ambulatory Visit: Payer: Self-pay | Admitting: Gastroenterology

## 2022-02-17 ENCOUNTER — Ambulatory Visit (INDEPENDENT_AMBULATORY_CARE_PROVIDER_SITE_OTHER): Payer: BC Managed Care – PPO | Admitting: Family

## 2022-02-17 VITALS — BP 130/92 | HR 70 | Temp 98.1°F | Ht 70.0 in | Wt 179.4 lb

## 2022-02-17 DIAGNOSIS — S81801A Unspecified open wound, right lower leg, initial encounter: Secondary | ICD-10-CM | POA: Diagnosis not present

## 2022-02-17 DIAGNOSIS — Z09 Encounter for follow-up examination after completed treatment for conditions other than malignant neoplasm: Secondary | ICD-10-CM

## 2022-02-17 DIAGNOSIS — L918 Other hypertrophic disorders of the skin: Secondary | ICD-10-CM
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
## 2022-02-17 NOTE — Patient Instructions (Signed)
It was very nice to see you today!   I have sent a new referral to another Dermatology office in town, they will call you to schedule.  Continue to take the '81mg'$  Aspirin daily for now.  I have sent a referral to our wound center to have your right leg wound assessed, again they will call you directly.  Glad you are doing better!      PLEASE NOTE:  If you had any lab tests please let us know if you have not heard back within a few days. You may see your results on MyChart before we have a chance to review them but we will give you a call once they are reviewed by [place]. If we ordered any referrals today, please let us know if you have not heard from their office within the next week.

## 2022-03-01 ENCOUNTER — Encounter: Payer: Self-pay | Admitting: Family

## 2022-03-02 ENCOUNTER — Encounter (HOSPITAL_BASED_OUTPATIENT_CLINIC_OR_DEPARTMENT_OTHER): Payer: BC Managed Care – PPO | Attending: Internal Medicine | Admitting: Internal Medicine

## 2022-03-02 DIAGNOSIS — S81811A Laceration without foreign body, right lower leg, initial encounter: Secondary | ICD-10-CM | POA: Diagnosis not present

## 2022-03-02 DIAGNOSIS — X58XXXA Exposure to other specified factors, initial encounter: Secondary | ICD-10-CM | POA: Insufficient documentation

## 2022-03-02 NOTE — Progress Notes (Signed)
TREYSHAWN, MULDREW ([id number]) Visit Report for 03/02/2022 Allergy List Details Patient Name: Date of Service: Karl Davis [place] 03/02/2022 8:00 A M Medical Record Number: [id number] Patient Account Number: [id number] Date of Birth/Sex: Treating RN: [birth year] (61 y.o. Karl Davis Primary Care Karl Davis: Karl Davis Other Clinician: Referring Errin Davis: Treating Karl Davis/Extender: Karl Davis: 0 Allergies Active Allergies No Known Allergies Allergy Notes Electronic Signature(s) Signed: 03/02/2022 3:58:21 PM By: Karl Hammock RN Entered By: Karl Davis on 03/01/2022 12:44:26 -------------------------------------------------------------------------------- Arrival Information Details Patient Name: Date of Service: Karl Brunner MA S J. 03/02/2022 8:00 A M Medical Record Number: [id number] Patient Account Number: [id number] Date of Birth/Sex: Treating RN: Karl Davis (61 y.o. Karl Davis, Karl Davis Primary Care Karl Davis: Karl Davis Other Clinician: Referring Bryona Foxworthy: Treating Carry Weesner/Extender: Karl Davis in Davis: 0 Visit Information Patient Arrived: Ambulatory Arrival Time: 08:17 Accompanied By: self Transfer Assistance: None Patient Identification Verified: Yes Secondary Verification Process Completed: Yes Patient Requires Transmission-Based Precautions: No Patient Has Alerts: No Electronic Signature(s) Signed: 03/02/2022 3:58:21 PM By: Karl Hammock RN Entered By: Karl Davis on 03/02/2022 08:17:32 -------------------------------------------------------------------------------- Clinic Level of Care Assessment Details Patient Name: Date of Service: Karl Davis. 03/02/2022 8:00 A M Medical Record Number: [id number] Patient Account Number: [id number] Date of Birth/Sex: Treating RN: [birth year]Davis (61 y.o. Karl Davis, Karl Davis Primary Care Karl Davis: Karl Davis Other Clinician: Referring Athaliah Baumbach: Treating Karl Davis/Extender: Karl Davis in Davis: 0 Clinic Level of Care Assessment Items TOOL 4 Quantity Score X- 1 0 Use when only an EandM is performed on FOLLOW-UP visit ASSESSMENTS - Nursing Assessment / Reassessment X- 1 10 Reassessment of Co-morbidities (includes updates in patient status) X- 1 5 Reassessment of Adherence to Davis Plan ASSESSMENTS - Wound and Skin A ssessment / Reassessment X - Simple Wound Assessment / Reassessment - one wound 1 5 '[]'$  - 0 Complex Wound Assessment / Reassessment - multiple wounds '[]'$  - 0 Dermatologic / Skin Assessment (not related to wound area) ASSESSMENTS - Focused Assessment X- 1 5 Circumferential Edema Measurements - multi extremities '[]'$  - 0 Nutritional Assessment / Counseling / Intervention '[]'$  - 0 Lower Extremity Assessment (monofilament, tuning fork, pulses) '[]'$  - 0 Peripheral Arterial Disease Assessment (using hand held doppler) ASSESSMENTS - Ostomy and/or Continence Assessment and Care '[]'$  - 0 Incontinence Assessment and Management '[]'$  - 0 Ostomy Care Assessment and Management (repouching, etc.) PROCESS - Coordination of Care X - Simple Patient / Family Education for ongoing care 1 15 '[]'$  - 0 Complex (extensive) Patient / Family Education for ongoing care X- 1 10 Staff obtains Programmer, systems, Records, T Results / Process Orders est '[]'$  - 0 Staff telephones HHA, Nursing Homes / Clarify orders / etc '[]'$  - 0 Routine Transfer to another Facility (non-emergent condition) '[]'$  - 0 Routine Hospital Admission (non-emergent condition) X- 1 15 New Admissions / Biomedical engineer / Ordering NPWT Apligraf, etc. , '[]'$  - 0 Emergency Hospital Admission (emergent condition) X- 1 10 Simple Discharge Coordination '[]'$  - 0 Complex (extensive) Discharge Coordination PROCESS - Special Needs '[]'$  - 0 Pediatric / Minor Patient Management '[]'$  - 0 Isolation Patient  Management '[]'$  - 0 Hearing / Language / Visual special needs '[]'$  - 0 Assessment of Community assistance (transportation, D/C planning, etc.) '[]'$  - 0 Additional assistance / Altered mentation '[]'$  - 0 Support Surface(s) Assessment (bed, cushion, seat, etc.) INTERVENTIONS - Wound Cleansing / Measurement X - Simple Wound Cleansing - one wound  1 5 '[]'$  - 0 Complex Wound Cleansing - multiple wounds X- 1 5 Wound Imaging (photographs - any number of wounds) '[]'$  - 0 Wound Tracing (instead of photographs) X- 1 5 Simple Wound Measurement - one wound '[]'$  - 0 Complex Wound Measurement - multiple wounds INTERVENTIONS - Wound Dressings X - Small Wound Dressing one or multiple wounds 1 10 '[]'$  - 0 Medium Wound Dressing one or multiple wounds '[]'$  - 0 Large Wound Dressing one or multiple wounds X- 1 5 Application of Medications - topical '[]'$  - 0 Application of Medications - injection INTERVENTIONS - Miscellaneous '[]'$  - 0 External ear exam '[]'$  - 0 Specimen Collection (cultures, biopsies, blood, body fluids, etc.) '[]'$  - 0 Specimen(s) / Culture(s) sent or taken to Lab for analysis '[]'$  - 0 Patient Transfer (multiple staff / Civil Service fast streamer / Similar devices) '[]'$  - 0 Simple Staple / Suture removal (25 or less) '[]'$  - 0 Complex Staple / Suture removal (26 or more) '[]'$  - 0 Hypo / Hyperglycemic Management (close monitor of Blood Glucose) '[]'$  - 0 Ankle / Brachial Index (ABI) - do not check if billed separately X- 1 5 Vital Signs Has the patient been seen at the hospital within the last three years: Yes Total Score: 110 Level Of Care: New/Established - Level 3 Electronic Signature(s) Signed: 03/02/2022 3:58:21 PM By: Karl Hammock RN Entered By: Karl Davis on 03/02/2022 09:02:45 -------------------------------------------------------------------------------- Encounter Discharge Information Details Patient Name: Date of Service: Karl Orpah Greek MA S J. 03/02/2022 8:00 A M Medical Record Number:  [id number] Patient Account Number: [id number] Date of Birth/Sex: Treating RN: [birth year] Davis (61 y.o. Karl Davis, Karl Davis Primary Care Obbie Lewallen: Karl Davis Other Clinician: Referring Kaniel Kiang: Treating Madylyn Insco/Extender: Karl Davis in Davis: 0 Encounter Discharge Information Items Discharge Condition: Stable Ambulatory Status: Ambulatory Discharge Destination: Home Transportation: Private Auto Accompanied By: self Schedule Follow-up Appointment: Yes Clinical Summary of Care: Patient Declined Electronic Signature(s) Signed: 03/02/2022 3:58:21 PM By: Karl Hammock RN Entered By: Karl Davis on 03/02/2022 09:03:30 -------------------------------------------------------------------------------- Lower Extremity Assessment Details Patient Name: Date of Service: Karl Orpah Greek [place] S J. 03/02/2022 8:00 A M Medical Record Number: [id number] Patient Account Number: [id number] Date of Birth/Sex: Treating RN: Davis[birth year] (61 y.o. Karl Davis, Karl Davis Primary Care Nala Kachel: Karl Davis Other Clinician: Referring Tashera Montalvo: Treating Katisha Shimizu/Extender: Karl Davis in Davis: 0 Edema Assessment Assessed: Shirlyn Goltz: No] [Right: Yes] E[Left: dema] [Right: :] Calf Left: Right: Point of Measurement: From Medial Instep 32 cm Ankle Left: Right: Point of Measurement: From Medial Instep 19 cm Vascular Assessment Pulses: Dorsalis Pedis Palpable: [Right:Yes] Posterior Tibial Palpable: [Right:Yes] Blood Pressure: Brachial: [Right:135] Ankle: [Right:Dorsalis Pedis: 150 1.11] Electronic Signature(s) Signed: 03/02/2022 3:58:21 PM By: Karl Hammock RN Entered By: Karl Davis on 03/02/2022 08:31:54 -------------------------------------------------------------------------------- Multi Wound Chart Details Patient Name: Date of Service: Karl Orpah Greek MA S J. 03/02/2022 8:00 A M Medical Record Number:  [id number] Patient Account Number: [id number] Date of Birth/Sex: Treating RN: [birth year] (61 y.o. Karl Davis, Karl Davis Primary Care Rease Swinson: Karl Davis Other Clinician: Referring Ekam Besson: Treating Angelena Sand/Extender: Karl Davis in Davis: 0 Vital Signs Height(in): 70 Pulse(bpm): 82 Weight(lbs): 173 Blood Pressure(mmHg): 128/85 Body Mass Index(BMI): 24.8 Temperature(F): 98 Respiratory Rate(breaths/min): 17 Wound Assessments Davis Notes Electronic Signature(s) Signed: 03/02/2022 10:45:22 AM By: Kalman Shan DO Signed: 03/02/2022 3:58:21 PM By: Karl Hammock RN Entered By: Kalman Shan on 03/02/2022 10:11:06 -------------------------------------------------------------------------------- Multi-Disciplinary Care Plan Details Patient Name: Date of Service: Karl Jenetta Downer RE, THO MA S  J. 03/02/2022 8:00 A M Medical Record Number: [id number] Patient Account Number: [id number] Date of Birth/Sex: Treating RN: [birth year] (61 y.o. Karl Davis Primary Care Vijay Durflinger: Karl Davis Other Clinician: Referring June Vacha: Treating Kamrin Sibley/Extender: Karl Davis in Davis: 0 Active Inactive Electronic Signature(s) Signed: 03/02/2022 3:58:21 PM By: Karl Hammock RN Entered By: Karl Davis on 03/02/2022 09:01:36 -------------------------------------------------------------------------------- Pain Assessment Details Patient Name: Date of Service: Karl Orpah Greek MA S J. 03/02/2022 8:00 A M Medical Record Number: [id number] Patient Account Number: [id number] Date of Birth/Sex: Treating RN: [birth year]Davis (61 y.o. Karl Davis, Karl Davis Primary Care Rondarius Kadrmas: Karl Davis Other Clinician: Referring Vondell Babers: Treating Ersel Enslin/Extender: Karl Davis in Davis: 0 Active Problems Location of Pain Severity and Description of Pain Patient Has Paino Yes Site  Locations Pain Location: Generalized Pain, Pain in Ulcers With Dressing Change: Yes Duration of the Pain. Constant / Intermittento Intermittent Rate the pain. Current Pain Level: 6 Worst Pain Level: 10 Least Pain Level: 0 Tolerable Pain Level: 6 Character of Pain Describe the Pain: Aching Pain Management and Medication Current Pain Management: Medication: No Cold Application: No Rest: No Massage: No Activity: No T.E.N.S.: No Heat Application: No Leg drop or elevation: No Is the Current Pain Management Adequate: Adequate How does your wound impact your activities of daily livingo Sleep: No Bathing: No Appetite: No Relationship With Others: No Bladder Continence: No Emotions: No Bowel Continence: No Work: No Toileting: No Drive: No Dressing: No Hobbies: No Electronic Signature(s) Signed: 03/02/2022 3:58:21 PM By: Karl Hammock RN Entered By: Karl Davis on 03/02/2022 08:19:48 -------------------------------------------------------------------------------- Patient/Caregiver Education Details Patient Name: Date of Service: Karl Percival Spanish 8/24/2023andnbsp8:00 A M Medical Record Number: [id number] Patient Account Number: [id number] Date of Birth/Gender: Treating RN: [birth year]Davis (61 y.o. Karl Davis Primary Care Physician: Karl Davis Other Clinician: Referring Physician: Treating Physician/Extender: Karl Davis in Davis: 0 Education Assessment Education Provided To: Patient Education Topics Provided Wound/Skin Impairment: Methods: Explain/Verbal Responses: Reinforcements needed, State content correctly Electronic Signature(s) Signed: 03/02/2022 3:58:21 PM By: Karl Hammock RN Entered By: Karl Davis on 03/02/2022 09:01:43 -------------------------------------------------------------------------------- Vitals Details Patient Name: Date of Service: Karl Orpah Greek MA S J. 03/02/2022 8:00 A  M Medical Record Number: [id number] Patient Account Number: [id number] Date of Birth/Sex: Treating RN: [birth year] (61 y.o. Karl Davis, Karl Davis Primary Care Soni Kegel: Karl Davis Other Clinician: Referring Litha Lamartina: Treating Marisa Hufstetler/Extender: Karl Davis in Davis: 0 Vital Signs Time Taken: 08:23 Temperature (F): 98 Height (in): 70 Pulse (bpm): 82 Source: Stated Respiratory Rate (breaths/min): 17 Weight (lbs): 173 Blood Pressure (mmHg): 128/85 Source: Stated Reference Range: 80 - 120 mg / dl Body Mass Index (BMI): 24.8 Electronic Signature(s) Signed: 03/02/2022 3:58:21 PM By: Karl Hammock RN Entered By: Karl Davis on 03/02/2022 08:23:59

## 2022-03-02 NOTE — Progress Notes (Signed)
SAMVEL, ZINN ([id number]) Visit Report for 03/02/2022 Chief Complaint Document Details Patient Name: Date of Service: Madelyn Brunner [place] 03/02/2022 8:00 A M Medical Record Number: [id number] Patient Account Number: [id number] Date of Birth/Sex: Treating RN: 01-Jun-1961 (61 y.o. [name], [name] Primary Care Provider: Jeanie Sewer Other Clinician: Referring Provider: Treating Provider/Extender: Marcheta Grammes in Treatment: 0 Information Obtained from: Patient Chief Complaint 03/02/2022; history of wound to the right leg Electronic Signature(s) Signed: 03/02/2022 10:45:22 AM By: Kalman Shan DO Entered By: Kalman Shan on 03/02/2022 10:11:22 -------------------------------------------------------------------------------- HPI Details Patient Name: Date of Service: [name] MA S J. 03/02/2022 8:00 A M Medical Record Number: [id number] Patient Account Number: [id number] Date of Birth/Sex: Treating RN: [birth year] (61 y.o. Erie Noe Primary Care Provider: Jeanie Sewer Other Clinician: Referring Provider: Treating Provider/Extender: Marcheta Grammes in Treatment: 0 History of Present Illness HPI Description: 03/02/2022 Mr. Diogo Anne is a 61 year old male with a past medical history of cervical radiculopathy status post spinal fusion that presents to the clinic for a recent history of wound to the right lower extremity. On 01/05/2022 he presented to the ED for a laceration to the right leg he experienced while doing yard work. He had staples placed. Subsequently the leg became infected and he visited urgent care for this issue on 01/19/2022 and was given antibiotics. Staples have been removed. Since then he has reported improvement in wound healing. He now has a scab to the previous wound site. He denies signs of infection. He reports no drainage. Electronic Signature(s) Signed: 03/02/2022 10:45:22 AM  By: Kalman Shan DO Entered By: Kalman Shan on 03/02/2022 10:18:35 -------------------------------------------------------------------------------- Physical Exam Details Patient Name: Date of Service: MO Roney Mans J. 03/02/2022 8:00 A M Medical Record Number: [id number] Patient Account Number: [id number] Date of Birth/Sex: Treating RN: October 12, 1960 (61 y.o. Erie Noe Primary Care Provider: Jeanie Sewer Other Clinician: Referring Provider: Treating Provider/Extender: Marcheta Grammes in Treatment: 0 Constitutional respirations regular, non-labored and within target range for patient.. Cardiovascular 2+ dorsalis pedis/posterior tibialis pulses. Psychiatric pleasant and cooperative. Notes Right lower extremity: T the anterior aspect there is a loose scab that when removed area underneath appears is almost completely healed. Pinpoint opening o with granulation tissue and no undermining. No drainage noted. Electronic Signature(s) Signed: 03/02/2022 10:45:22 AM By: Kalman Shan DO Entered By: Kalman Shan on 03/02/2022 10:15:50 -------------------------------------------------------------------------------- Physician Orders Details Patient Name: Date of Service: MO Kathe Mariner, Shannan Harper MA S J. 03/02/2022 8:00 A M Medical Record Number: [id number] Patient Account Number: [id number] Date of Birth/Sex: Treating RN: 06-13-1961 (61 y.o. [name], [name] Primary Care Provider: Jeanie Sewer Other Clinician: Referring Provider: Treating Provider/Extender: Marcheta Grammes in Treatment: 0 Verbal / Phone Orders: No Diagnosis Coding Follow-up Appointments Other: - Consult only Use antibiotic ointment and bandaid Discharge From [facility] Services Discharge from [place] Signature(s) Signed: 03/02/2022 10:45:22 AM By: Kalman Shan DO Entered By: Kalman Shan on 03/02/2022  10:15:55 -------------------------------------------------------------------------------- Problem List Details Patient Name: Date of Service: Madelyn Brunner MA S J. 03/02/2022 8:00 A M Medical Record Number: [id number] Patient Account Number: [id number] Date of Birth/Sex: Treating RN: 11-17-1960 (61 y.o. Erie Noe Primary Care Provider: Jeanie Sewer Other Clinician: Referring Provider: Treating Provider/Extender: Marcheta Grammes in Treatment: 0 Active Problems ICD-10 Encounter Code Description Active Date MDM Diagnosis S81.811A Laceration without foreign body, right lower leg, initial encounter  03/02/2022 No Yes Inactive Problems Resolved Problems Electronic Signature(s) Signed: 03/02/2022 10:45:22 AM By: Kalman Shan DO Entered By: Kalman Shan on 03/02/2022 10:10:56 -------------------------------------------------------------------------------- Progress Note Details Patient Name: Date of Service: [name] MA S J. 03/02/2022 8:00 A M Medical Record Number: [id number] Patient Account Number: [id number] Date of Birth/Sex: Treating RN: July 23, 1960 (61 y.o. Erie Noe Primary Care Provider: Jeanie Sewer Other Clinician: Referring Provider: Treating Provider/Extender: Marcheta Grammes in Treatment: 0 Subjective Chief Complaint Information obtained from Patient 03/02/2022; history of wound to the right leg History of Present Illness (HPI) 03/02/2022 Mr. Amadou Katzenstein is a 61 year old male with a past medical history of cervical radiculopathy status post spinal fusion that presents to the clinic for a recent history of wound to the right lower extremity. On 01/05/2022 he presented to the ED for a laceration to the right leg he experienced while doing yard work. He had staples placed. Subsequently the leg became infected and he visited urgent care for this issue on 01/19/2022 and was given  antibiotics. Staples have been removed. Since then he has reported improvement in wound healing. He now has a scab to the previous wound site. He denies signs of infection. He reports no drainage. Patient History Information obtained from Patient, Chart. Allergies No Known Allergies Family History Cancer - Father, Diabetes - Mother. Social History Former smoker - cigars, Alcohol Use - Never, Drug Use - No History, Caffeine Use - Rarely. Medical History Musculoskeletal Patient has history of Osteoarthritis Hospitalization/Surgery History - c4/c5 sspine surgery. - carpal tunnel surgery. - hernia repair. Medical A Surgical History Notes nd Gastrointestinal GERD Psychiatric SUBSTANCE ABUSE HX Review of Systems (ROS) Constitutional Symptoms (General Health) Denies complaints or symptoms of Fatigue, Fever, Chills, Marked Weight Change. Eyes Denies complaints or symptoms of Dry Eyes, Vision Changes, Glasses / Contacts. Ear/Nose/Mouth/Throat Denies complaints or symptoms of Chronic sinus problems or rhinitis. Respiratory Denies complaints or symptoms of Chronic or frequent coughs, Shortness of Breath. Cardiovascular Denies complaints or symptoms of Chest pain. Endocrine Denies complaints or symptoms of Heat/cold intolerance. Genitourinary Denies complaints or symptoms of Frequent urination. Integumentary (Skin) Complains or has symptoms of Wounds. Neurologic Denies complaints or symptoms of Numbness/parasthesias. Objective Constitutional respirations regular, non-labored and within target range for patient.. Vitals Time Taken: 8:23 AM, Height: 70 in, Source: Stated, Weight: 173 lbs, Source: Stated, BMI: 24.8, Temperature: 98 F, Pulse: 82 bpm, Respiratory Rate: 17 breaths/min, Blood Pressure: 128/85 mmHg. Cardiovascular 2+ dorsalis pedis/posterior tibialis pulses. Psychiatric pleasant and cooperative. General Notes: Right lower extremity: T the anterior aspect there is a  loose scab that when removed area underneath appears is almost completely healed. o Pinpoint opening with granulation tissue and no undermining. No drainage noted. Assessment Active Problems ICD-10 Laceration without foreign body, right lower leg, initial encounter Patient has a history of laceration to the right leg that required staples in the ED and antibiotics. Overall he has healed well. He had a small scab today and underneath this was a very small pinpoint open area. I recommended antibiotic ointment and keeping the area covered for the next week. He should heal up without any issues. I have offered him follow-up but he states he will call us if needed. Plan Follow-up Appointments: Other: - Consult only Use antibiotic ointment and bandaid Discharge From [facility] Services: Discharge from [place] 1. Follow-up as needed Electronic Signature(s) Signed: 03/02/2022 10:45:22 AM By: Kalman Shan DO Entered By: Kalman Shan on 03/02/2022 10:18:53 -------------------------------------------------------------------------------- HxROS Details  Patient Name: Date of Service: Madelyn Brunner [place] 03/02/2022 8:00 A M Medical Record Number: [id number] Patient Account Number: [id number] Date of Birth/Sex: Treating RN: Aug 03, 1960 (61 y.o. Erie Noe Primary Care Provider: Jeanie Sewer Other Clinician: Referring Provider: Treating Provider/Extender: Marcheta Grammes in Treatment: 0 Information Obtained From Patient Chart Constitutional Symptoms (General Health) Complaints and Symptoms: Negative for: Fatigue; Fever; Chills; Marked Weight Change Eyes Complaints and Symptoms: Negative for: Dry Eyes; Vision Changes; Glasses / Contacts Ear/Nose/Mouth/Throat Complaints and Symptoms: Negative for: Chronic sinus problems or rhinitis Respiratory Complaints and Symptoms: Negative for: Chronic or frequent coughs; Shortness of  Breath Cardiovascular Complaints and Symptoms: Negative for: Chest pain Endocrine Complaints and Symptoms: Negative for: Heat/cold intolerance Genitourinary Complaints and Symptoms: Negative for: Frequent urination Integumentary (Skin) Complaints and Symptoms: Positive for: Wounds Neurologic Complaints and Symptoms: Negative for: Numbness/parasthesias Hematologic/Lymphatic Gastrointestinal Medical History: Past Medical History Notes: GERD Immunological Musculoskeletal Medical History: Positive for: Osteoarthritis Oncologic Psychiatric Medical History: Past Medical History Notes: SUBSTANCE ABUSE HX Immunizations Pneumococcal Vaccine: Received Pneumococcal Vaccination: No Implantable Devices None Hospitalization / Surgery History Type of Hospitalization/Surgery c4/c5 sspine surgery carpal tunnel surgery hernia repair Family and Social History Cancer: Yes - Father; Diabetes: Yes - Mother; Former smoker - cigars; Alcohol Use: Never; Drug Use: No History; Caffeine Use: Rarely; Financial Concerns: No; Food, Clothing or Shelter Needs: No; Support System Lacking: No; Transportation Concerns: No Electronic Signature(s) Signed: 03/02/2022 10:45:22 AM By: Kalman Shan DO Signed: 03/02/2022 3:58:21 PM By: Rhae Hammock RN Entered By: Rhae Hammock on 03/01/2022 12:49:00 -------------------------------------------------------------------------------- SuperBill Details Patient Name: Date of Service: Madelyn Brunner [place] 03/02/2022 Medical Record Number: [id number] Patient Account Number: [id number] Date of Birth/Sex: Treating RN: February 13, 1961 (61 y.o. Erie Noe Primary Care Provider: Jeanie Sewer Other Clinician: Referring Provider: Treating Provider/Extender: Marcheta Grammes in Treatment: 0 Diagnosis Coding ICD-10 Codes Code Description [phone] Laceration without foreign body, right lower leg, initial  encounter Physician Procedures : CPT4 Code Description Modifier [id number] - WC PHYS LEVEL 4 - NEW PT ICD-10 Diagnosis Description J17.915A Laceration without foreign body, right lower leg, initial encounter Quantity: 1 Electronic Signature(s) Signed: 03/02/2022 10:45:22 AM By: Kalman Shan DO Entered By: Kalman Shan on 03/02/2022 10:19:02

## 2022-03-02 NOTE — Progress Notes (Signed)
SILER, MAVIS ([id number]) Visit Report for 03/02/2022 Abuse Risk Screen Details Patient Name: Date of Service: Madelyn Brunner [place] 03/02/2022 8:00 A M Medical Record Number: [id number] Patient Account Number: [id number] Date of Birth/Sex: Treating RN: 1960-08-19 (61 y.o. [name], [name] Primary Care Kerolos Nehme: Jeanie Sewer Other Clinician: Referring Marlee Trentman: Treating Evann Erazo/Extender: Marcheta Grammes in Treatment: 0 Abuse Risk Screen Items Answer ABUSE RISK SCREEN: Has anyone close to you tried to hurt or harm you recentlyo No Do you feel uncomfortable with anyone in your familyo No Has anyone forced you do things that you didnt want to doo No Electronic Signature(s) Signed: 03/02/2022 3:58:21 PM By: Rhae Hammock RN Entered By: Rhae Hammock on 03/02/2022 08:17:42 -------------------------------------------------------------------------------- Activities of Daily Living Details Patient Name: Date of Service: Duanne [place] 03/02/2022 8:00 A M Medical Record Number: [id number] Patient Account Number: [id number] Date of Birth/Sex: Treating RN: 29-Jul-1960 (61 y.o. [name], [name] Primary Care Seraya Jobst: Jeanie Sewer Other Clinician: Referring Kahlee Metivier: Treating Zenda Herskowitz/Extender: Marcheta Grammes in Treatment: 0 Activities of Daily Living Items Answer Activities of Daily Living (Please select one for each item) Drive Automobile Completely Able T Medications ake Completely Able Use T elephone Completely Able Care for Appearance Completely Able Use T oilet Completely Able Bath / Shower Completely Able Dress Self Completely Able Feed Self Completely Able Walk Completely Able Get In / Out Bed Completely Able Housework Completely Able Prepare Meals Completely [place] for Self Completely Able Electronic Signature(s) Signed: 03/02/2022 3:58:21 PM By: Rhae Hammock RN Entered By: Rhae Hammock on 03/01/2022 12:49:15 -------------------------------------------------------------------------------- Education Screening Details Patient Name: Date of Service: [name] MA S J. 03/02/2022 8:00 A M Medical Record Number: [id number] Patient Account Number: [id number] Date of Birth/Sex: Treating RN: 1961-05-22 (61 y.o. [name], [name] Primary Care Seidy Labreck: Jeanie Sewer Other Clinician: Referring Nafisa Olds: Treating Cove Haydon/Extender: Marcheta Grammes in Treatment: 0 Primary Learner Assessed: Patient Learning Preferences/Education Level/Primary Language Learning Preference: Explanation, Demonstration, Communication Board, Printed Material Highest Education Level: High School Preferred Language: English Cognitive Barrier Language Barrier: No Translator Needed: No Memory Deficit: No Emotional Barrier: No Cultural/Religious Beliefs Affecting Medical Care: No Physical Barrier Impaired Vision: No Impaired Hearing: No Decreased Hand dexterity: No Knowledge/Comprehension Knowledge Level: High Comprehension Level: High Ability to understand written instructions: High Ability to understand verbal instructions: High Motivation Anxiety Level: Calm Cooperation: Cooperative Education Importance: Denies Need Interest in Health Problems: Asks Questions Perception: Coherent Willingness to Engage in Self-Management High Activities: Readiness to Engage in Self-Management High Activities: Electronic Signature(s) Signed: 03/02/2022 3:58:21 PM By: Rhae Hammock RN Entered By: Rhae Hammock on 03/01/2022 12:56:51 -------------------------------------------------------------------------------- Fall Risk Assessment Details Patient Name: Date of Service: [place] O RE, Shannan Harper MA S J. 03/02/2022 8:00 A M Medical Record Number: [id number] Patient Account Number: [id number] Date of Birth/Sex: Treating RN: 1960-10-02  (60 y.o. [name], [name] Primary Care Edell Mesenbrink: Jeanie Sewer Other Clinician: Referring Haylea Schlichting: Treating Lumina Gitto/Extender: Marcheta Grammes in Treatment: 0 Fall Risk Assessment Items Have you had 2 or more falls in the last 12 monthso 0 No Have you had any fall that resulted in injury in the last 12 monthso 0 No FALLS RISK SCREEN History of falling - immediate or within 3 months 0 No Secondary diagnosis (Do you have 2 or more medical diagnoseso) 0 No Ambulatory aid None/bed rest/wheelchair/nurse 0 No Crutches/cane/walker 0 No Furniture 0 No Intravenous therapy  Access/Saline/Heparin Lock 0 No Gait/Transferring Normal/ bed rest/ wheelchair 0 No Weak (short steps with or without shuffle, stooped but able to lift head while walking, may seek 0 No support from furniture) Impaired (short steps with shuffle, may have difficulty arising from chair, head down, impaired 0 No balance) Mental Status Oriented to own ability 0 No Electronic Signature(s) Signed: 03/02/2022 3:58:21 PM By: Rhae Hammock RN Entered By: Rhae Hammock on 03/02/2022 08:18:00 -------------------------------------------------------------------------------- Foot Assessment Details Patient Name: Date of Service: [name] MA S J. 03/02/2022 8:00 A M Medical Record Number: [id number] Patient Account Number: [id number] Date of Birth/Sex: Treating RN: 1961-02-16 (60 y.o. [name], [name] Primary Care Miliyah Luper: Jeanie Sewer Other Clinician: Referring Gal Feldhaus: Treating Azha Constantin/Extender: Marcheta Grammes in Treatment: 0 Foot Assessment Items Site Locations + = Sensation present, - = Sensation absent, C = Callus, U = Ulcer R = Redness, W = Warmth, M = Maceration, PU = Pre-ulcerative lesion F = Fissure, S = Swelling, D = Dryness Assessment Right: Left: Other Deformity: No No Prior Foot Ulcer: No No Prior Amputation: No No Charcot  Joint: No No Ambulatory Status: Ambulatory Without Help Gait: Steady Electronic Signature(s) Signed: 03/02/2022 3:58:21 PM By: Rhae Hammock RN Entered By: Rhae Hammock on 03/02/2022 08:19:05 -------------------------------------------------------------------------------- Nutrition Risk Screening Details Patient Name: Date of Service: [place] Percival Spanish. 03/02/2022 8:00 A M Medical Record Number: [id number] Patient Account Number: [id number] Date of Birth/Sex: Treating RN: 1961-04-17 (61 y.o. Erie Noe Primary Care Tavarious Freel: Jeanie Sewer Other Clinician: Referring Mahi Zabriskie: Treating Kierstin January/Extender: Marcheta Grammes in Treatment: 0 Height (in): Weight (lbs): Body Mass Index (BMI): Nutrition Risk Screening Items Score Screening NUTRITION RISK SCREEN: I have an illness or condition that made me change the kind and/or amount of food I eat 0 No I eat fewer than two meals per day 0 No I eat few fruits and vegetables, or milk products 0 No I have three or more drinks of beer, liquor or wine almost every day 0 No I have tooth or mouth problems that make it hard for me to eat 0 No I don't always have enough money to buy the food I need 0 No I eat alone most of the time 0 No I take three or more different prescribed or over-the-counter drugs a day 0 No Without wanting to, I have lost or gained 10 pounds in the last six months 0 No I am not always physically able to shop, cook and/or feed myself 0 No Nutrition Protocols Good Risk Protocol 0 No interventions needed Moderate Risk Protocol High Risk Proctocol Risk Level: Good Risk Score: 0 Electronic Signature(s) Signed: 03/02/2022 3:58:21 PM By: Rhae Hammock RN Entered By: Rhae Hammock on 03/02/2022 08:18:05

## 2022-03-15 ENCOUNTER — Ambulatory Visit (INDEPENDENT_AMBULATORY_CARE_PROVIDER_SITE_OTHER): Payer: BC Managed Care – PPO | Admitting: Orthopaedic Surgery

## 2022-03-15 ENCOUNTER — Encounter: Payer: Self-pay | Admitting: Orthopaedic Surgery

## 2022-03-15 ENCOUNTER — Ambulatory Visit (INDEPENDENT_AMBULATORY_CARE_PROVIDER_SITE_OTHER): Payer: BC Managed Care – PPO

## 2022-03-15 VITALS — BP 138/91 | HR 74 | Ht 70.0 in | Wt 174.0 lb

## 2022-03-15 DIAGNOSIS — G5603 Carpal tunnel syndrome, bilateral upper limbs: Secondary | ICD-10-CM | POA: Diagnosis not present

## 2022-03-15 DIAGNOSIS — S32048A Other fracture of fourth lumbar vertebra, initial encounter for closed fracture: Secondary | ICD-10-CM | POA: Diagnosis not present

## 2022-03-15 DIAGNOSIS — M4722 Other spondylosis with radiculopathy, cervical region: Secondary | ICD-10-CM
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
## 2022-04-03 ENCOUNTER — Encounter: Payer: Self-pay | Admitting: *Deleted

## 2022-04-04 ENCOUNTER — Ambulatory Visit: Payer: BC Managed Care – PPO | Admitting: Dermatology

## 2022-05-18 ENCOUNTER — Other Ambulatory Visit: Payer: Self-pay | Admitting: Gastroenterology

## 2022-05-18 NOTE — Telephone Encounter (Signed)
Patient is requesting Pantoprazole. Please advise.

## 2022-05-24 ENCOUNTER — Other Ambulatory Visit: Payer: Self-pay | Admitting: *Deleted

## 2022-05-24 MED ORDER — PANTOPRAZOLE SODIUM 40 MG PO TBEC: DELAYED_RELEASE_TABLET | ORAL | 2 refills | Status: DC

## 2022-06-18 ENCOUNTER — Other Ambulatory Visit: Payer: Self-pay | Admitting: Family

## 2022-06-18 DIAGNOSIS — F900 Attention-deficit hyperactivity disorder, predominantly inattentive type: Secondary | ICD-10-CM

## 2022-06-22 ENCOUNTER — Encounter: Payer: Self-pay | Admitting: *Deleted

## 2022-07-04 ENCOUNTER — Ambulatory Visit
Admission: RE | Admit: 2022-07-04 | Discharge: 2022-07-04 | Disposition: A | Discharge status: Home / Self Care | Payer: BC Managed Care – PPO | Source: Ambulatory Visit | Attending: Emergency Medicine | Admitting: Emergency Medicine

## 2022-07-04 VITALS — BP 118/70 | HR 76 | Temp 98.1°F | Resp 16

## 2022-07-04 DIAGNOSIS — R42 Dizziness and giddiness: Secondary | ICD-10-CM | POA: Diagnosis not present

## 2022-07-04 DIAGNOSIS — K529 Noninfective gastroenteritis and colitis, unspecified: Secondary | ICD-10-CM | POA: Diagnosis not present

## 2022-07-04 LAB — POCT URINALYSIS DIP (MANUAL ENTRY)
Blood, UA: NEGATIVE
Glucose, UA: NEGATIVE mg/dL
Leukocytes, UA: NEGATIVE
Nitrite, UA: NEGATIVE
Protein Ur, POC: 100 mg/dL — AB
Spec Grav, UA: >=1.03 — AB (ref 1.010–1.025)
Urobilinogen, UA: 2 E.U./dL — AB
pH, UA: 6 (ref 5.0–8.0)

## 2022-07-04 MED ORDER — SODIUM CHLORIDE 0.9 % IV BOLUS: 500.0000 mL | Freq: Once | INTRAVENOUS | Status: DC

## 2022-07-04 MED ORDER — ONDANSETRON 4 MG PO TBDP: 4.0000 mg | ORAL_TABLET | Freq: Three times a day (TID) | ORAL | 0 refills | Status: DC | PRN

## 2022-07-04 MED ORDER — LOPERAMIDE HCL 2 MG PO TABS: 2.0000 mg | ORAL_TABLET | Freq: Four times a day (QID) | ORAL | 0 refills | Status: DC | PRN

## 2022-07-04 NOTE — ED Notes (Signed)
Attempt to insert IV x3, no success. Provider made aware.

## 2022-07-04 NOTE — ED Provider Notes (Signed)
[facility]    CSN: [id number] Arrival date & time: 07/04/22  1017    HISTORY   Chief Complaint  Patient presents with   Nausea    I have had astomach bug for 6 days that is not getting much better.   I went to Urgent Care on Friday and was prescribed two meds - Odansetron and Dicyclomine. - Entered by patient   Diarrhea   Abdominal Pain   Vomiting   Headache   HPI Karl Davis is a pleasant, 61 y.o. male who presents to urgent care today. Patient complains of a "stomach bug" for the past 6 days which is not improving.  Patient states he has been having nausea, vomiting and diarrhea.  Patient states that for the past 3 days he has been having headache and dizziness as well.  Patient states he went to an urgent care 5 days ago, was prescribed Zofran and dicyclomine which he states are not helping him resolve his symptoms.  Patient states he has had stomach bugs in the past, they have never lasted this long.  Patient denies known sick contacts.  Patient denies blood in stool, mucus in stool, hematemesis.  Patient states he has not vomited for the past 3 days and that he has had a significant decrease in frequency and quantity of stool.  States he continues to have abdominal cramping as if he needs to move his bowels.  States he has had a reduced appetite but has been tolerating bananas, crackers, chicken noodle soup and small amounts.  Patient states he also been drinking plenty of water, ginger ale and fruit juice.  The history is provided by the patient.   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   Rt side   SPINE SURGERY  09/2001   Neck surgery C4/C5    Home Medications    Prior to Admission medications   Medication Sig Start Date End Date Taking? Authorizing Provider  acetaminophen (TYLENOL) 500 MG tablet Take 2 tablets (1,000 mg total) by mouth every 6 (six) hours. 01/26/22   Jill Alexanders, PA-C  aspirin EC 81 MG tablet Take 1 tablet (81 mg total) by mouth daily. Swallow whole. 01/26/22   Jill Alexanders, PA-C  buPROPion (WELLBUTRIN SR) 150 MG 12 hr tablet TAKE 1 TABLET BY MOUTH DAILY FOR 3 DAYS, THEN 1 TABLET TWICE DAILY 06/19/22   Jeanie Sewer, NP  famotidine (PEPCID) 20 MG tablet Take 20 mg by mouth daily as needed for heartburn. 01/22/08   [provider]  ibuprofen (ADVIL) 200 MG tablet Take 600 mg by mouth 2 (two) times daily as needed for mild pain. 01/22/08   [provider]  lidocaine (LIDODERM) 5 % Place 1 patch onto the skin daily. Apply to sternum or right chest wall. Remove & Discard patch within 12 hours or as directed by MD 01/26/22   Jill Alexanders, PA-C  methocarbamol 1000 MG TABS Take 1,000 mg by mouth every 8 (eight) hours as needed for muscle spasms. 01/26/22   Jill Alexanders, PA-C  pantoprazole (PROTONIX) 40 MG tablet TAKE 1 TABLET(40 MG) BY MOUTH DAILY 05/26/22   Nandigam, Venia Minks, MD  pantoprazole (PROTONIX) 40 MG tablet TAKE 1 TABLET(40 MG) BY MOUTH DAILY 05/24/22   Nandigam, Venia Minks, MD  Tetrahydrozoline HCl (VISINE OP) Place 1 drop into both eyes 2 (two) times daily as  needed (dry eyes).    [provider]  traMADol (ULTRAM) 50 MG tablet Take 50 mg by mouth every 6 (six) hours as needed. 02/08/22   [provider]    Family History Family History  Problem Relation Age of Onset   Depression Mother    Diabetes Mother    Prostate cancer Father    Depression Sister    Depression Sister    ADD / ADHD Son    Social History Social History   Tobacco Use   Smoking status: Former    Types: Stage manager exposure: Yes   Smokeless tobacco: Former  Scientific laboratory technician Use: Never used  Substance Use Topics   Alcohol use: Not Currently   Drug use: Not Currently    Types: Amphetamines, "Crack" cocaine, Marijuana, LSD   Allergies   Patient has no known allergies.  Review of Systems Review of Systems Pertinent findings revealed after performing a 14 point review of systems has been noted in the history of present illness.  Physical Exam Triage Vital Signs ED Triage Vitals  Enc Vitals Group     BP 05/06/21 0827 (!) 147/82     Pulse Rate 05/06/21 0827 72     Resp 05/06/21 0827 18     Temp 05/06/21 0827 98.3 F (36.8 C)     Temp Source 05/06/21 0827 Oral     SpO2 05/06/21 0827 98 %     Weight --      Height --      Head Circumference --      Peak Flow --      Pain Score 05/06/21 0826 5     Pain Loc --      Pain Edu? --      Excl. in [place]? --   No data found.  Updated Vital Signs BP 118/70 (BP Location: Right Arm)   Pulse 76   Temp 98.1 F (36.7 C) (Oral)   Resp 16   SpO2 98%   Physical Exam Vitals and nursing note reviewed.  Constitutional:      General: He is not in acute distress.    Appearance: Normal appearance. He is not ill-appearing.  HENT:     Head: Normocephalic and atraumatic.  Eyes:     General: Lids are normal.        Right eye: No discharge.        Left eye: No discharge.     Extraocular Movements: Extraocular movements intact.     Conjunctiva/sclera: Conjunctivae normal.     Right eye: Right  conjunctiva is not injected.     Left eye: Left conjunctiva is not injected.  Neck:     Trachea: Trachea and phonation normal.  Cardiovascular:  Rate and Rhythm: Normal rate and regular rhythm.     Pulses: Normal pulses.     Heart sounds: Normal heart sounds. No murmur heard.    No friction rub. No gallop.  Pulmonary:     Effort: Pulmonary effort is normal. No accessory muscle usage, prolonged expiration or respiratory distress.     Breath sounds: Normal breath sounds. No stridor, decreased air movement or transmitted upper airway sounds. No decreased breath sounds, wheezing, rhonchi or rales.  Chest:     Chest wall: No tenderness.  Abdominal:     General: Abdomen is flat. Bowel sounds are normal. There is no distension.     Palpations: Abdomen is soft.     Tenderness: There is no abdominal tenderness. There is no right CVA tenderness or left CVA tenderness.     Hernia: No hernia is present.  Musculoskeletal:        General: Normal range of motion.     Cervical back: Normal range of motion and neck supple. Normal range of motion.  Lymphadenopathy:     Cervical: No cervical adenopathy.  Skin:    General: Skin is warm and dry.     Findings: No erythema or rash.  Neurological:     General: No focal deficit present.     Mental Status: He is alert and oriented to person, place, and time.  Psychiatric:        Mood and Affect: Mood normal.        Behavior: Behavior normal.     Visual Acuity Right Eye Distance:   Left Eye Distance:   Bilateral Distance:    Right Eye Near:   Left Eye Near:    Bilateral Near:     [facility] / Diagnostics / Procedures:     Radiology No results found.  Procedures Procedures (including critical care time) EKG  Pending results:  Labs Reviewed  BASIC METABOLIC PANEL  POCT URINALYSIS DIP (MANUAL ENTRY)    Medications Ordered in UC: Medications  sodium chloride 0.9 % bolus 500 mL (has no administration in time range)    UC Diagnoses  / Final Clinical Impressions(s)   I have reviewed the triage vital signs and the nursing notes.  Pertinent labs & imaging results that were available during my care of the patient were reviewed by me and considered in my medical decision making (see chart for details).    Final diagnoses:  Gastroenteritis  Dizziness  Lightheadedness   Patient provided with a prescription for Imodium and Zofran for relief of diarrhea and nausea respectively.  Note provided for work.  Return precautions advised.  Please see discharge instructions below for details of plan of care as provided to patient. ED Prescriptions     Medication Sig Dispense Auth. Provider   loperamide (IMODIUM A-D) 2 MG tablet Take 1 tablet (2 mg total) by mouth 4 (four) times daily as needed for diarrhea or loose stools. 30 tablet Lynden Oxford Scales, PA-C   ondansetron (ZOFRAN-ODT) 4 MG disintegrating tablet Take 1 tablet (4 mg total) by mouth every 8 (eight) hours as needed for nausea or vomiting. 20 tablet Lynden Oxford Scales, PA-C      PDMP not reviewed this encounter.  Pending results:  Labs Reviewed  BASIC METABOLIC PANEL  POCT URINALYSIS DIP (MANUAL ENTRY)    Discharge Instructions:   Discharge Instructions      At this time, the only thing I would advise you to do differently would be to be sure that you  are only drinking electrolyte replacement fluids such as Gatorade, Powerade, Pedialyte, until your stools are consistently solid.    Your urinalysis today did show that you are moderately dehydrated.  I believe that this is the reason we are unable to secure IV access so that we can give you fluids.  Please make sure you drink about 60 ounces of Gatorade before the end of the day today and do the same tomorrow.  I have included some information about foods to eat to help resolve diarrhea.  I also recommend that you change dicyclomine to Imodium A-D, which I have prescribed for you because overall you appear  to be improving and dicyclomine is a fairly strong bowel paralytic.  Imodium is a little gentler and will be a better transition back to normal bowel function for you.  I recommend that you begin taking ibuprofen 400 mg 3 times daily to reduce inflammation of your gastrointestinal tract.  Finally, please continue Zofran as needed for dizziness.  The results of your blood test today will be posted to your MyChart account once complete.  If there are any significant deficits in your levels of potassium, sodium or calcium, you will be contacted by phone and prescriptions will be provided for replacement.  Thank you for visiting urgent care today.      Disposition Upon Discharge:  Condition: stable for discharge home  Patient presented with an acute illness with associated systemic symptoms and significant discomfort requiring urgent management. In my opinion, this is a condition that a prudent lay person (someone who possesses an average knowledge of health and medicine) may potentially expect to result in complications if not addressed urgently such as respiratory distress, impairment of bodily function or dysfunction of bodily organs.   Routine symptom specific, illness specific and/or disease specific instructions were discussed with the patient and/or caregiver at length.   As such, the patient has been evaluated and assessed, work-up was performed and treatment was provided in alignment with urgent care protocols and evidence based medicine.  Patient/parent/caregiver has been advised that the patient may require follow up for further testing and treatment if the symptoms continue in spite of treatment, as clinically indicated and appropriate.  Patient/parent/caregiver has been advised to return to the [facility] or PCP if no better; to PCP or the Emergency Department if new signs and symptoms develop, or if the current signs or symptoms continue to change or worsen for further workup, evaluation  and treatment as clinically indicated and appropriate  The patient will follow up with their current PCP if and as advised. If the patient does not currently have a PCP we will assist them in obtaining one.   The patient may need specialty follow up if the symptoms continue, in spite of conservative treatment and management, for further workup, evaluation, consultation and treatment as clinically indicated and appropriate.  Patient/parent/caregiver verbalized understanding and agreement of plan as discussed.  All questions were addressed during visit.  Please see discharge instructions below for further details of plan.  This office note has been dictated using Museum/gallery curator.  Unfortunately, this method of dictation can sometimes lead to typographical or grammatical errors.  I apologize for your inconvenience in advance if this occurs.  Please do not hesitate to reach out to me if clarification is needed.      Lynden Oxford Scales, PA-C 07/06/22 1348

## 2022-07-04 NOTE — Discharge Instructions (Addendum)
At this time, the only thing I would advise you to do differently would be to be sure that you are only drinking electrolyte replacement fluids such as Gatorade, Powerade, Pedialyte, until your stools are consistently solid.    Your urinalysis today did show that you are moderately dehydrated.  I believe that this is the reason we are unable to secure IV access so that we can give you fluids.  Please make sure you drink about 60 ounces of Gatorade before the end of the day today and do the same tomorrow.  I have included some information about foods to eat to help resolve diarrhea.  I also recommend that you change dicyclomine to Imodium A-D, which I have prescribed for you because overall you appear to be improving and dicyclomine is a fairly strong bowel paralytic.  Imodium is a little gentler and will be a better transition back to normal bowel function for you.  I recommend that you begin taking ibuprofen 400 mg 3 times daily to reduce inflammation of your gastrointestinal tract.  Finally, please continue Zofran as needed for dizziness.  The results of your blood test today will be posted to your MyChart account once complete.  If there are any significant deficits in your levels of potassium, sodium or calcium, you will be contacted by phone and prescriptions will be provided for replacement.  Thank you for visiting urgent care today.

## 2022-07-04 NOTE — ED Triage Notes (Signed)
Pt c/o abd pain that began about a week ago with nausea/ vomiting/ diarrhea. The patient states he now has been having headaches and dizziness (started about 2-3 days ago).   Home interventions: Dicyclomine, Zofran

## 2022-07-05 LAB — BASIC METABOLIC PANEL
BUN/Creatinine Ratio: 13 (ref 10–24)
BUN: 15 mg/dL (ref 8–27)
CO2: 22 mmol/L (ref 20–29)
Calcium: 9.4 mg/dL (ref 8.6–10.2)
Chloride: 103 mmol/L (ref 96–106)
Creatinine, Ser: 1.14 mg/dL (ref 0.76–1.27)
Glucose: 99 mg/dL (ref 70–99)
Potassium: 4.7 mmol/L (ref 3.5–5.2)
Sodium: 138 mmol/L (ref 134–144)
eGFR: 72 mL/min/{1.73_m2} (ref >59)

## 2022-08-03 ENCOUNTER — Encounter: Payer: Self-pay | Admitting: Family

## 2022-08-03 ENCOUNTER — Ambulatory Visit: Payer: BC Managed Care – PPO | Admitting: Family

## 2022-08-03 VITALS — BP 136/87 | HR 69 | Temp 97.7°F | Ht 70.0 in | Wt 178.5 lb

## 2022-08-03 DIAGNOSIS — R413 Other amnesia: Secondary | ICD-10-CM | POA: Diagnosis not present

## 2022-08-03 DIAGNOSIS — F900 Attention-deficit hyperactivity disorder, predominantly inattentive type: Secondary | ICD-10-CM

## 2022-08-03 DIAGNOSIS — R251 Tremor, unspecified: Secondary | ICD-10-CM

## 2022-08-03 DIAGNOSIS — M25551 Pain in right hip: Secondary | ICD-10-CM

## 2022-08-03 MED ORDER — MELOXICAM 7.5 MG PO TABS: 7.5000 mg | ORAL_TABLET | Freq: Every day | ORAL | 0 refills | Status: DC
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
## 2022-08-03 NOTE — Assessment & Plan Note (Signed)
chronic starting to have tremors over last 14mo or so unsure if r/t Bupropion, pt willing to wean off and see advised to wean down to 1 pill qd x1w, then 1/2 pill qd x1w, then stop f/u next mo w/ physical

## 2022-08-10 NOTE — Progress Notes (Signed)
Karl Davis D.[facility] [place] Phone: [phone]   Assessment and Plan:     1. Bilateral hip pain 2. 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  Tolerated well per note below - Optional to continue meloxicam 7.5 to 15 mg daily for 1 to 2 weeks, but would then discontinue daily use - Start HEP for hip - Agree with patient getting a new mattress - X-ray obtained in clinic.  My interpretation: No acute fracture or dislocation.  Mild bilateral intra-articular cortical changes, more prominent on right than left.  Cortical changes around ischial tuberosity   Procedure: Greater trochanteric bursal injection Side: right  Risks explained and consent was given verbally. The site was cleaned with alcohol prep. A steroid injection was performed with patient in the lateral side-lying position at area of maximum tenderness over greater trochanter using 68m of 1% lidocaine without epinephrine and 110mof kenalog '40mg'$ /ml. This was well tolerated and resulted in symptomatic relief.  Needle was removed, hemostasis achieved, and post injection instructions were explained.  Pt was advised to call or return to clinic if these symptoms worsen or fail to improve as anticipated.    Pertinent previous records reviewed include none     Follow Up: 3 weeks for reevaluation.  Could also further evaluate patient Pain   Subjective:   I, Moenique Parris, am serving as a scEducation administratoror Doctor BeGlennon MacChief Complaint: bilateral hip pain   HPI:   08/11/2022 Patient is a [age]ear old male complaining of bilateral hip pain. Patient states reports having for  years, but it is getting worse. Reports pain after sitting for long periods, denies any injury or previous surgeries, no previous imaging or evaluation. Gets some relief with Ibuprofen & Tylenol, no MOI, no numbness or tingling , states his mattress could be the issue, has pain when he is hiking he enjoys hiking, intermittent pain , meloxicam has been helping   Relevant Historical Information: None pertinent  Additional pertinent review of systems negative.   Current Outpatient Medications:    acetaminophen (TYLENOL) 500 MG tablet, Take 2 tablets (1,000 mg total) by mouth every 6 (six) hours., Disp: 30 tablet, Rfl: 0   aspirin EC 81 MG tablet, Take 1 tablet (81 mg total) by mouth daily. Swallow whole., Disp: 30 tablet, Rfl: 12   buPROPion (WELLBUTRIN SR) 150 MG 12 hr tablet, TAKE 1 TABLET BY MOUTH DAILY FOR 3 DAYS, THEN 1 TABLET TWICE DAILY, Disp: 180 tablet, Rfl: 0   famotidine (PEPCID) 20 MG tablet, Take 20 mg by mouth daily as needed for heartburn., Disp: , Rfl:    ibuprofen (ADVIL) 200 MG tablet, Take 600 mg by mouth 2 (two) times daily as needed for mild pain., Disp: , Rfl:    lidocaine (LIDODERM) 5 %, Place 1 patch onto the skin daily. Apply to sternum or right chest wall. Remove & Discard patch within 12 hours or as directed by MD, Disp: 15 patch, Rfl: 1   loperamide (IMODIUM A-D) 2 MG tablet, Take 1 tablet (2 mg total) by mouth 4 (four) times daily as needed for diarrhea or loose stools., Disp: 30 tablet, Rfl: 0   meloxicam (MOBIC) 7.5 MG tablet,  Take 1 tablet (7.5 mg total) by mouth daily. OK to take 1 pill bid or 2 pills at one time if needed., Disp: 60 tablet, Rfl: 0   methocarbamol 1000 MG TABS, Take 1,000 mg by mouth every 8 (eight) hours as needed for muscle spasms., Disp: 40 tablet, Rfl: 1   ondansetron (ZOFRAN-ODT) 4 MG disintegrating tablet, Take 1 tablet (4 mg total) by mouth every 8 (eight) hours as needed for nausea or vomiting., Disp: 20 tablet, Rfl: 0   pantoprazole (PROTONIX)  40 MG tablet, TAKE 1 TABLET(40 MG) BY MOUTH DAILY, Disp: 30 tablet, Rfl: 2   Tetrahydrozoline HCl (VISINE OP), Place 1 drop into both eyes 2 (two) times daily as needed (dry eyes)., Disp: , Rfl:    traMADol (ULTRAM) 50 MG tablet, Take 50 mg by mouth every 6 (six) hours as needed. (Patient not taking: Reported on 08/03/2022), Disp: , Rfl:    Objective:     Vitals:   08/11/22 0825  BP: (!) 140/80  Pulse: 63  SpO2: 100%  Weight: 178 lb (80.7 kg)  Height: '5\' 10"'$  (1.778 m)      Body mass index is 25.54 kg/m.    Physical Exam:    General: awake, alert, and oriented no acute distress, nontoxic Skin: no suspicious lesions or rashes Neuro:sensation intact distally with no deficits, normal muscle tone, no atrophy, strength 5/5 in all tested lower ext groups Psych: normal mood and affect, speech clear   Bilateral hip: No deformity, swelling or wasting ROM Flexion 90, ext 30, IR 45, ER 45 TTP greater trochanter, worse on right versus left NTTP over the hip flexors,  gluteal musculature, si joint, lumbar spine Negative log roll with FROM Negative FABER Negative FADIR on the left, positive on right for lateral hip pain Negative Piriformis test, though caused bilateral lateral hip discomfort Negative trendelenberg Gait normal    Electronically signed by:  Karl Davis D.[facility] 8:55 AM 08/11/22

## 2022-08-11 ENCOUNTER — Ambulatory Visit (INDEPENDENT_AMBULATORY_CARE_PROVIDER_SITE_OTHER): Payer: BC Managed Care – PPO

## 2022-08-11 ENCOUNTER — Ambulatory Visit: Payer: BC Managed Care – PPO | Admitting: Sports Medicine

## 2022-08-11 VITALS — BP 140/80 | HR 63 | Ht 70.0 in | Wt 178.0 lb

## 2022-08-11 DIAGNOSIS — M7062 Trochanteric bursitis, left hip: Secondary | ICD-10-CM

## 2022-08-11 DIAGNOSIS — M7061 Trochanteric bursitis, right hip: Secondary | ICD-10-CM

## 2022-08-11 DIAGNOSIS — M25551 Pain in right hip: Secondary | ICD-10-CM | POA: Diagnosis not present

## 2022-08-11 DIAGNOSIS — M25552 Pain in left hip: Secondary | ICD-10-CM

## 2022-08-11 NOTE — Patient Instructions (Signed)
Tylenol [phone] mg 2-3 times a day for pain relief  Meloxicam or ibuprofen as needed for breakthrough pain recommend no more than 1-2 times a week  Hip  HEP 3 week follow up
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
## 2022-08-15 ENCOUNTER — Encounter: Payer: Self-pay | Admitting: Physician Assistant

## 2022-08-15 ENCOUNTER — Ambulatory Visit: Payer: BC Managed Care – PPO | Admitting: Physician Assistant

## 2022-08-15 ENCOUNTER — Other Ambulatory Visit (INDEPENDENT_AMBULATORY_CARE_PROVIDER_SITE_OTHER): Payer: BC Managed Care – PPO

## 2022-08-15 VITALS — BP 145/89 | HR 99 | Resp 20 | Ht 70.0 in | Wt 179.0 lb

## 2022-08-15 DIAGNOSIS — R413 Other amnesia: Secondary | ICD-10-CM | POA: Diagnosis not present

## 2022-08-15 LAB — VITAMIN B12: Vitamin B-12: 200 pg/mL — ABNORMAL LOW (ref 211–911)

## 2022-08-15 NOTE — Patient Instructions (Addendum)
It was a pleasure to see you today at our office.   Recommendations:  Neurocognitive evaluation  MRI of the brain, the radiology office will call you to arrange you appointment Check labs today Follow up in 1 month  Whom to call:  Memory  decline, memory medications: Call our office [phone]   For psychiatric meds, mood meds: Please have your primary care physician manage these medications.     For guidance in geriatric dementia issues please call [facility] [phone]    If you have any severe symptoms of a stroke, or other severe issues such as confusion,severe chills or fever, etc call 911 or go to the ER as you may need to be evaluated further    Feel free to go to the following database for funded clinical studies conducted around the world: http://saunders.com/   https://www.triadclinicaltrials.com/     RECOMMENDATIONS FOR ALL PATIENTS WITH MEMORY PROBLEMS: 1. Continue to exercise (Recommend 30 minutes of walking everyday, or 3 hours every week) 2. Increase social interactions - continue going to [place] and enjoy social gatherings with friends and family 3. Eat healthy, avoid fried foods and eat more fruits and vegetables 4. Maintain adequate blood pressure, blood sugar, and blood cholesterol level. Reducing the risk of stroke and cardiovascular disease also helps promoting better memory. 5. Avoid stressful situations. Live a simple life and avoid aggravations. Organize your time and prepare for the next day in anticipation. 6. Sleep well, avoid any interruptions of sleep and avoid any distractions in the bedroom that may interfere with adequate sleep quality 7. Avoid sugar, avoid sweets as there is a strong link between excessive sugar intake, diabetes, and cognitive impairment We discussed the Mediterranean diet, which has been shown to help patients reduce the risk of progressive memory disorders and reduces cardiovascular risk. This includes  eating fish, eat fruits and green leafy vegetables, nuts like almonds and hazelnuts, walnuts, and also use olive oil. Avoid fast foods and fried foods as much as possible. Avoid sweets and sugar as sugar use has been linked to worsening of memory function.  There is always a concern of gradual progression of memory problems. If this is the case, then we may need to adjust level of care according to patient needs. Support, both to the patient and caregiver, should then be put into place.      FALL PRECAUTIONS: Be cautious when walking. Scan the area for obstacles that may increase the risk of trips and falls. When getting up in the mornings, sit up at the edge of the bed for a few minutes before getting out of bed. Consider elevating the bed at the head end to avoid drop of blood pressure when getting up. Walk always in a well-lit room (use night lights in the walls). Avoid area rugs or power cords from appliances in the middle of the walkways. Use a walker or a cane if necessary and consider physical therapy for balance exercise. Get your eyesight checked regularly.  FINANCIAL OVERSIGHT: Supervision, especially oversight when making financial decisions or transactions is also recommended.  HOME SAFETY: Consider the safety of the kitchen when operating appliances like stoves, microwave oven, and blender. Consider having supervision and share cooking responsibilities until no longer able to participate in those. Accidents with firearms and other hazards in the house should be identified and addressed as well.   ABILITY TO BE LEFT ALONE: If patient is unable to contact 911 operator, consider using [employer], or when the need is  there, arrange for someone to stay with patients. Smoking is a fire hazard, consider supervision or cessation. Risk of wandering should be assessed by caregiver and if detected at any point, supervision and safe proof recommendations should be instituted.  MEDICATION SUPERVISION:  Inability to self-administer medication needs to be constantly addressed. Implement a mechanism to ensure safe administration of the medications.   DRIVING: Regarding driving, in patients with progressive memory problems, driving will be impaired. We advise to have someone else do the driving if trouble finding directions or if minor accidents are reported. Independent driving assessment is available to determine safety of driving.   If you are interested in the driving assessment, you can contact the following:  [employer] in [place]  [place] [phone] or [phone]    [place] refers to food and lifestyle choices that are based on the traditions of countries located on the [employer]. This way of eating has been shown to help prevent certain conditions and improve outcomes for people who have chronic diseases, like kidney disease and heart disease. What are tips for following this plan? Lifestyle  Cook and eat meals together with your family, when possible. Drink enough fluid to keep your urine clear or pale yellow. Be physically active every day. This includes: Aerobic exercise like running or swimming. Leisure activities like gardening, walking, or housework. Get 7-8 hours of sleep each night. If recommended by your health care provider, drink red wine in moderation. This means 1 glass a day for nonpregnant women and 2 glasses a day for men. A glass of wine equals 5 oz (150 mL). Reading food labels  Check the serving size of packaged foods. For foods such as rice and pasta, the serving size refers to the amount of cooked product, not dry. Check the total fat in packaged foods. Avoid foods that have saturated fat or trans fats. Check the ingredients list for added sugars, such as corn syrup. Shopping  At the grocery  store, buy most of your food from the areas near the walls of the store. This includes: Fresh fruits and vegetables (produce). Grains, beans, nuts, and seeds. Some of these may be available in unpackaged forms or large amounts (in bulk). Fresh seafood. Poultry and eggs. Low-fat dairy products. Buy whole ingredients instead of prepackaged foods. Buy fresh fruits and vegetables in-season from local farmers markets. Buy frozen fruits and vegetables in resealable bags. If you do not have access to quality fresh seafood, buy precooked frozen shrimp or canned fish, such as tuna, salmon, or sardines. Buy small amounts of raw or cooked vegetables, salads, or olives from the deli or salad bar at your store. Stock your pantry so you always have certain foods on hand, such as olive oil, canned tuna, canned tomatoes, rice, pasta, and beans. Cooking  Cook foods with extra-virgin olive oil instead of using butter or other vegetable oils. Have meat as a side dish, and have vegetables or grains as your main dish. This means having meat in small portions or adding small amounts of meat to foods like pasta or stew. Use beans or vegetables instead of meat in common dishes like chili or lasagna. Experiment with different cooking methods. Try roasting or broiling vegetables instead of steaming or sauteing them. Add frozen vegetables to soups, stews, pasta, or rice. Add nuts or seeds for added healthy fat at each meal. You can add these  to yogurt, salads, or vegetable dishes. Marinate fish or vegetables using olive oil, lemon juice, garlic, and fresh herbs. Meal planning  Plan to eat 1 vegetarian meal one day each week. Try to work up to 2 vegetarian meals, if possible. Eat seafood 2 or more times a week. Have healthy snacks readily available, such as: Vegetable sticks with hummus. Greek yogurt. Fruit and nut trail mix. Eat balanced meals throughout the week. This includes: Fruit: 2-3 servings a  day Vegetables: 4-5 servings a day Low-fat dairy: 2 servings a day Fish, poultry, or lean meat: 1 serving a day Beans and legumes: 2 or more servings a week Nuts and seeds: 1-2 servings a day Whole grains: 6-8 servings a day Extra-virgin olive oil: 3-4 servings a day Limit red meat and sweets to only a few servings a month What are my food choices? Mediterranean diet Recommended Grains: Whole-grain pasta. Brown rice. Bulgar wheat. Polenta. Couscous. Whole-wheat bread. [place]. Vegetables: Artichokes. Beets. Broccoli. Cabbage. Carrots. Eggplant. Green beans. Chard. Kale. Spinach. Onions. Leeks. Peas. Squash. Tomatoes. Peppers. Radishes. Fruits: Apples. Apricots. Avocado. Berries. Bananas. Cherries. Dates. Figs. Grapes. Lemons. Melon. Oranges. Peaches. Plums. Pomegranate. Meats and other protein foods: Beans. Almonds. Sunflower seeds. Pine nuts. Peanuts. [place]. Salmon. [place]all[place]. Shrimp. [place]. Tilapia. Clams. Oysters. Eggs. Dairy: Low-fat milk. Cheese. Greek yogurt. Beverages: Water. Red wine. Herbal tea. Fats and oils: Extra virgin olive oil. Avocado oil. Grape seed oil. Sweets and desserts: [place] yogurt with honey. Baked apples. Poached pears. Trail mix. Seasoning and other foods: Basil. Cilantro. Coriander. Cumin. Mint. Parsley. Sage. Rosemary. Tarragon. Garlic. Oregano. Thyme. Pepper. Balsalmic vinegar. Tahini. Hummus. Tomato sauce. Olives. Mushrooms. Limit these Grains: Prepackaged pasta or rice dishes. Prepackaged cereal with added sugar. Vegetables: Deep fried potatoes (french fries). Fruits: Fruit canned in syrup. Meats and other protein foods: Beef. Pork. Lamb. Poultry with skin. Hot dogs. [place] Salines. Dairy: Ice cream. Sour cream. Whole milk. Beverages: Juice. Sugar-sweetened soft drinks. Beer. Liquor and spirits. Fats and oils: Butter. Canola oil. Vegetable oil. Beef fat (tallow). Lard. Sweets and desserts: Cookies. Cakes. Pies. Candy. Seasoning and other foods: Mayonnaise.  Premade sauces and marinades. The items listed may not be a complete list. Talk with your dietitian about what dietary choices are right for you. Summary The Mediterranean diet includes both food and lifestyle choices. Eat a variety of fresh fruits and vegetables, beans, nuts, seeds, and whole grains. Limit the amount of red meat and sweets that you eat. Talk with your health care provider about whether it is safe for you to drink red wine in moderation. This means 1 glass a day for nonpregnant women and 2 glasses a day for men. A glass of wine equals 5 oz (150 mL). This information is not intended to replace advice given to you by your health care provider. Make sure you discuss any questions you have with your health care provider. Document Released: 02/17/2016 Document Revised: 03/21/2016 Document Reviewed: 02/17/2016 Elsevier Interactive Patient Education  [address] [place] [address] [phone]

## 2022-08-16 NOTE — Progress Notes (Signed)
B12 is low, needs  to take B12 1000 mcg daily, follow with PCP

## 2022-08-28 ENCOUNTER — Encounter: Payer: BC Managed Care – PPO | Admitting: Family

## 2022-08-29 NOTE — Progress Notes (Unsigned)
Karl Davis D.[facility] [place] Phone: [phone]   Assessment and Plan:     There are no diagnoses linked to this encounter.  ***   Pertinent previous records reviewed include ***   Follow Up: ***     Subjective:   I, Karl Davis, am serving as a Education administrator for Doctor Glennon Mac   Chief Complaint: bilateral hip pain    HPI:    08/11/2022 Patient is a 62 year old male complaining of bilateral hip pain. Patient states reports having for years, but it is getting worse. Reports pain after sitting for long periods, denies any injury or previous surgeries, no previous imaging or evaluation. Gets some relief with Ibuprofen & Tylenol, no MOI, no numbness or tingling , states his mattress could be the issue, has pain when he is hiking he enjoys hiking, intermittent pain , meloxicam has been helping   08/30/2022 Patient states    Relevant Historical Information: None pertinent  Additional pertinent review of systems negative.   Current Outpatient Medications:    acetaminophen (TYLENOL) 500 MG tablet, Take 2 tablets (1,000 mg total) by mouth every 6 (six) hours., Disp: 30 tablet, Rfl: 0   aspirin EC 81 MG tablet, Take 1 tablet (81 mg total) by mouth daily. Swallow whole., Disp: 30 tablet, Rfl: 12   buPROPion (WELLBUTRIN SR) 150 MG 12 hr tablet, TAKE 1 TABLET BY MOUTH DAILY FOR 3 DAYS, THEN 1 TABLET TWICE DAILY, Disp: 180 tablet, Rfl: 0   famotidine (PEPCID) 20 MG tablet, Take 20 mg by mouth daily as needed for heartburn. (Patient not taking: Reported on 08/15/2022), Disp: , Rfl:    ibuprofen (ADVIL) 200 MG tablet, Take 600 mg by mouth 2 (two) times daily as needed for mild pain., Disp: , Rfl:    lidocaine (LIDODERM) 5 %, Place 1 patch onto the skin daily. Apply to sternum or right chest wall. Remove & Discard patch within 12 hours or as directed by MD (Patient not taking: Reported on 08/15/2022), Disp: 15 patch,  Rfl: 1   loperamide (IMODIUM A-D) 2 MG tablet, Take 1 tablet (2 mg total) by mouth 4 (four) times daily as needed for diarrhea or loose stools. (Patient not taking: Reported on 08/15/2022), Disp: 30 tablet, Rfl: 0   meloxicam (MOBIC) 7.5 MG tablet, Take 1 tablet (7.5 mg total) by mouth daily. OK to take 1 pill bid or 2 pills at one time if needed., Disp: 60 tablet, Rfl: 0   methocarbamol 1000 MG TABS, Take 1,000 mg by mouth every 8 (eight) hours as needed for muscle spasms., Disp: 40 tablet, Rfl: 1   ondansetron (ZOFRAN-ODT) 4 MG disintegrating tablet, Take 1 tablet (4 mg total) by mouth every 8 (eight) hours as needed for nausea or vomiting., Disp: 20 tablet, Rfl: 0   pantoprazole (PROTONIX) 40 MG tablet, TAKE 1 TABLET(40 MG) BY MOUTH DAILY (Patient not taking: Reported on 08/15/2022), Disp: 30 tablet, Rfl: 2   Tetrahydrozoline HCl (VISINE OP), Place 1 drop into both eyes 2 (two) times daily as needed (dry eyes)., Disp: , Rfl:    traMADol (ULTRAM) 50 MG tablet, Take 50 mg by mouth every 6 (six) hours as needed. (Patient not taking: Reported on 08/03/2022), Disp: , Rfl:    Objective:     There were no vitals filed for this visit.    There is no height or weight on file to calculate BMI.    Physical Exam:    ***  Electronically signed by:  Karl Davis D.[facility] 9:14 AM 08/29/22

## 2022-08-30 ENCOUNTER — Encounter: Payer: Self-pay | Admitting: Physician Assistant

## 2022-08-30 ENCOUNTER — Ambulatory Visit: Payer: BC Managed Care – PPO | Admitting: Sports Medicine

## 2022-08-30 VITALS — BP 140/82 | HR 77 | Ht 70.0 in | Wt 181.0 lb

## 2022-08-30 DIAGNOSIS — M7061 Trochanteric bursitis, right hip: Secondary | ICD-10-CM

## 2022-08-30 DIAGNOSIS — M25551 Pain in right hip: Secondary | ICD-10-CM

## 2022-08-30 DIAGNOSIS — M5010 Cervical disc disorder with radiculopathy, unspecified cervical region: Secondary | ICD-10-CM

## 2022-08-30 DIAGNOSIS — M7062 Trochanteric bursitis, left hip: Secondary | ICD-10-CM

## 2022-08-30 DIAGNOSIS — Z981 Arthrodesis status: Secondary | ICD-10-CM | POA: Diagnosis not present

## 2022-08-30 DIAGNOSIS — M542 Cervicalgia: Secondary | ICD-10-CM

## 2022-08-30 DIAGNOSIS — M25552 Pain in left hip: Secondary | ICD-10-CM

## 2022-08-30 NOTE — Patient Instructions (Addendum)
Good to see you [facility]  Epidural referral right C6-c7 Follow up 2 weeks after to discuss results

## 2022-09-01 ENCOUNTER — Other Ambulatory Visit: Payer: BC Managed Care – PPO

## 2022-09-06 ENCOUNTER — Encounter: Payer: Self-pay | Admitting: Family

## 2022-09-06 ENCOUNTER — Ambulatory Visit (INDEPENDENT_AMBULATORY_CARE_PROVIDER_SITE_OTHER): Payer: BC Managed Care – PPO | Admitting: Family

## 2022-09-06 VITALS — BP 135/83 | HR 86 | Temp 97.8°F | Ht 70.0 in | Wt 181.5 lb

## 2022-09-06 DIAGNOSIS — Z0000 Encounter for general adult medical examination without abnormal findings: Secondary | ICD-10-CM | POA: Diagnosis not present

## 2022-09-06 DIAGNOSIS — Z23 Encounter for immunization: Secondary | ICD-10-CM

## 2022-09-06 LAB — CBC WITH DIFFERENTIAL/PLATELET
Basophils Absolute: 0.1 10*3/uL (ref 0.0–0.1)
Basophils Relative: 0.8 % (ref 0.0–3.0)
Eosinophils Absolute: 0.1 10*3/uL (ref 0.0–0.7)
Eosinophils Relative: 2.3 % (ref 0.0–5.0)
HCT: 46.2 % (ref 39.0–52.0)
Hemoglobin: 15.5 g/dL (ref 13.0–17.0)
Lymphocytes Relative: 25.5 % (ref 12.0–46.0)
Lymphs Abs: 1.5 10*3/uL (ref 0.7–4.0)
MCHC: 33.6 g/dL (ref 30.0–36.0)
MCV: 93.3 fl (ref 78.0–100.0)
Monocytes Absolute: 0.6 10*3/uL (ref 0.1–1.0)
Monocytes Relative: 9.3 % (ref 3.0–12.0)
Neutro Abs: 3.7 10*3/uL (ref 1.4–7.7)
Neutrophils Relative %: 62.1 % (ref 43.0–77.0)
Platelets: 192 10*3/uL (ref 150.0–400.0)
RBC: 4.95 Mil/uL (ref 4.22–5.81)
RDW: 13.7 % (ref 11.5–15.5)
WBC: 6 10*3/uL (ref 4.0–10.5)

## 2022-09-06 LAB — LIPID PANEL
Cholesterol: 191 mg/dL (ref 0–200)
HDL: 67.1 mg/dL (ref >39.00)
LDL Cholesterol: 105 mg/dL — ABNORMAL HIGH (ref 0–99)
NonHDL: 124.26
Total CHOL/HDL Ratio: 3
Triglycerides: 95 mg/dL (ref 0.0–149.0)
VLDL: 19 mg/dL (ref 0.0–40.0)

## 2022-09-06 LAB — COMPREHENSIVE METABOLIC PANEL
ALT: 19 U/L (ref 0–53)
AST: 17 U/L (ref 0–37)
Albumin: 4.3 g/dL (ref 3.5–5.2)
Alkaline Phosphatase: 52 U/L (ref 39–117)
BUN: 23 mg/dL (ref 6–23)
CO2: 29 mEq/L (ref 19–32)
Calcium: 9.6 mg/dL (ref 8.4–10.5)
Chloride: 106 mEq/L (ref 96–112)
Creatinine, Ser: 1.05 mg/dL (ref 0.40–1.50)
GFR: 75.6 mL/min (ref >60.00)
Glucose, Bld: 101 mg/dL — ABNORMAL HIGH (ref 70–99)
Potassium: 4.7 mEq/L (ref 3.5–5.1)
Sodium: 143 mEq/L (ref 135–145)
Total Bilirubin: 0.3 mg/dL (ref 0.2–1.2)
Total Protein: 6.6 g/dL (ref 6.0–8.3)

## 2022-09-06 LAB — TSH: TSH: 1.16 u[IU]/mL (ref 0.35–5.50)

## 2022-09-06 NOTE — Progress Notes (Unsigned)
Phone: [phone]  Subjective:  Patient 62 y.o. male presenting for annual physical.  Chief Complaint  Patient presents with   Annual Exam    Fasting w/ Labs    See problem oriented charting- ROS- full  review of systems was completed and negative.   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 1998?   Rt side   SPINE SURGERY  09/2001   Neck surgery C4/C5    Family History  Problem Relation Age of Onset   Depression Mother    Diabetes Mother    Prostate cancer Father    Depression Sister    Depression Sister    ADD / ADHD Son     Medications- reviewed and updated Current Outpatient Medications  Medication Sig Dispense Refill   acetaminophen (TYLENOL) 500 MG tablet Take 2 tablets (1,000 mg total) by mouth every 6 (six) hours. 30 tablet 0   aspirin EC 81 MG tablet Take 1 tablet (81 mg total) by mouth daily. Swallow whole. 30 tablet 12   buPROPion (WELLBUTRIN SR) 150 MG 12 hr tablet TAKE 1 TABLET BY MOUTH DAILY FOR 3 DAYS, THEN 1 TABLET TWICE DAILY 180 tablet 0   famotidine (PEPCID)  20 MG tablet Take 20 mg by mouth daily as needed for heartburn.     ibuprofen (ADVIL) 200 MG tablet Take 600 mg by mouth 2 (two) times daily as needed for mild pain.     lidocaine (LIDODERM) 5 % Place 1 patch onto the skin daily. Apply to sternum or right chest wall. Remove & Discard patch within 12 hours or as directed by MD 15 patch 1   loperamide (IMODIUM A-D) 2 MG tablet Take 1 tablet (2 mg total) by mouth 4 (four) times daily as needed for diarrhea or loose stools. 30 tablet 0   meloxicam (MOBIC) 7.5 MG tablet Take 1 tablet (7.5 mg total) by mouth daily. OK to take 1 pill bid or 2 pills at one time if needed. 60 tablet 0   methocarbamol 1000 MG TABS Take 1,000 mg by mouth every 8 (eight) hours as needed for muscle spasms. 40 tablet 1   ondansetron (ZOFRAN-ODT) 4 MG disintegrating tablet Take 1 tablet (4 mg total) by mouth every 8 (eight) hours as needed for nausea or vomiting. 20 tablet 0   pantoprazole (PROTONIX) 40 MG tablet TAKE 1 TABLET(40 MG) BY MOUTH DAILY 30 tablet 2   Tetrahydrozoline HCl (VISINE OP) Place 1 drop into both eyes 2 (two) times daily as needed (dry eyes).     No current facility-administered medications for this visit.    Allergies-reviewed and updated No  Known Allergies  Social History   Social History Narrative   Right handed   Two story home   Is currently working   Lives alone   Drinks caffeine    Objective:  BP 135/83 (BP Location: Left Arm, Patient Position: Sitting, Cuff Size: Large)   Pulse 86   Temp 97.8 F (36.6 C) (Temporal)   Ht '5\' 10"'$  (1.778 m)   Wt 181 lb 8 oz (82.3 kg)   SpO2 98%   BMI 26.04 kg/m  Physical Exam Vitals and nursing note reviewed.  Constitutional:      General: He is not in acute distress.    Appearance: Normal appearance.  HENT:     Head: Normocephalic.     Right Ear: Tympanic membrane and external ear normal.     Left Ear: Tympanic membrane and external ear normal.     Nose: Nose normal.     Mouth/Throat:      Mouth: Mucous membranes are moist.  Eyes:     Extraocular Movements: Extraocular movements intact.  Cardiovascular:     Rate and Rhythm: Normal rate and regular rhythm.  Pulmonary:     Effort: Pulmonary effort is normal.     Breath sounds: Normal breath sounds.  Abdominal:     General: Abdomen is flat. There is no distension.     Palpations: Abdomen is soft.     Tenderness: There is no abdominal tenderness.  Musculoskeletal:        General: Normal range of motion.     Cervical back: Normal range of motion.  Skin:    General: Skin is warm and dry.  Neurological:     Mental Status: He is alert and oriented to person, place, and time.  Psychiatric:        Mood and Affect: Mood normal.        Behavior: Behavior normal.        Judgment: Judgment normal.      Assessment and Plan   Health Maintenance counseling: 1. Anticipatory guidance: Patient counseled regarding regular dental exams q6 months, eye exams yearly, avoiding smoking and second hand smoke, limiting alcohol to 2 beverages per day.   2. Risk factor reduction:  Advised patient of need for regular exercise and diet rich in fruits and vegetables to reduce risk of heart attack and stroke. Exercise- walking..   Wt Readings from Last 3 Encounters:  09/06/22 181 lb 8 oz (82.3 kg)  08/30/22 181 lb (82.1 kg)  08/15/22 179 lb (81.2 kg)   3. Immunizations/screenings/ancillary studies Immunization History  Administered Date(s) Administered   Hepatitis B, ADULT 03/03/2014   Influenza, Seasonal, Injecte, Preservative Fre 08/14/2013, 09/07/2014   Influenza,inj,Quad PF,6+ Mos 09/09/2015, 08/26/2021, 09/06/2022   Influenza-Unspecified 04/14/2010, 07/06/2011, 07/23/2012, 08/14/2013, 09/07/2014   Tdap 03/11/2003, 08/12/2012, 01/05/2022   Health Maintenance Due  Topic Date Due   COVID-19 Vaccine (1) Never done   Hepatitis C Screening  Never done   Zoster Vaccines- Shingrix (1 of 2) Never done    4. Prostate cancer screening >55yo  - risk factors?  Lab Results  Component Value Date   PSA 2.59 08/26/2021    5. Colon cancer screening:11/2021 6. Skin cancer screening-  advised regular sunscreen use. Denies worrisome, changing, or new skin lesions.  7. Smoking associated screening (lung cancer screening, AAA screen 65-75, UA)- non- smoke 8. STD screening - N/A 9. Alcohol screening: none  Problem List Items Addressed This Visit   None Visit Diagnoses     Need  for influenza vaccination    -  Primary   Relevant Orders   Flu Vaccine QUAD 6+ mos PF IM (Fluarix Quad PF) (Completed)       Recommended follow up:  No follow-ups on file. Future Appointments  Date Time Provider [place]  09/09/2022  8:10 AM [facility]-315 MR 2 GI-315MRI [facility] W. [facility]  09/12/2022  9:00 AM GI-315 DG C-ARM RM 3 GI-315DG [facility] W. [facility]  09/19/2022  9:30 AM Rondel Jumbo, PA-C [facility] None    Lab/Order associations: fasting    Jeanie Sewer, NP

## 2022-09-08 ENCOUNTER — Encounter: Payer: Self-pay | Admitting: Physician Assistant

## 2022-09-09 ENCOUNTER — Other Ambulatory Visit: Payer: BC Managed Care – PPO

## 2022-09-12 ENCOUNTER — Ambulatory Visit
Admission: RE | Admit: 2022-09-12 | Discharge: 2022-09-12 | Disposition: A | Discharge status: Home / Self Care | Payer: BC Managed Care – PPO | Source: Ambulatory Visit | Attending: Sports Medicine | Admitting: Sports Medicine

## 2022-09-12 DIAGNOSIS — M542 Cervicalgia: Secondary | ICD-10-CM

## 2022-09-12 MED ORDER — IOPAMIDOL (ISOVUE-M 300) INJECTION 61%
1.0000 mL | Freq: Once | INTRAMUSCULAR | Status: AC | PRN
  Administered 2022-09-12: 1 mL via EPIDURAL

## 2022-09-12 MED ORDER — TRIAMCINOLONE ACETONIDE 40 MG/ML IJ SUSP (RADIOLOGY)
60.0000 mg | Freq: Once | INTRAMUSCULAR | Status: AC
  Administered 2022-09-12: 60 mg via EPIDURAL

## 2022-09-12 NOTE — Discharge Instructions (Signed)

## 2022-09-16 ENCOUNTER — Other Ambulatory Visit: Payer: Self-pay | Admitting: Family

## 2022-09-16 DIAGNOSIS — F900 Attention-deficit hyperactivity disorder, predominantly inattentive type: Secondary | ICD-10-CM

## 2022-09-19 ENCOUNTER — Ambulatory Visit: Payer: BC Managed Care – PPO | Admitting: Physician Assistant
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
## 2022-12-15 ENCOUNTER — Other Ambulatory Visit: Payer: Self-pay | Admitting: Gastroenterology

## 2023-02-13 ENCOUNTER — Other Ambulatory Visit: Payer: Self-pay | Admitting: Family

## 2023-02-13 ENCOUNTER — Other Ambulatory Visit: Payer: Self-pay

## 2023-02-13 DIAGNOSIS — F900 Attention-deficit hyperactivity disorder, predominantly inattentive type: Secondary | ICD-10-CM

## 2023-02-21 ENCOUNTER — Encounter: Payer: Self-pay | Admitting: Family

## 2023-02-21 ENCOUNTER — Other Ambulatory Visit: Payer: Self-pay | Admitting: Family

## 2023-02-21 DIAGNOSIS — F900 Attention-deficit hyperactivity disorder, predominantly inattentive type: Secondary | ICD-10-CM

## 2023-02-22 MED ORDER — BUPROPION HCL ER (SR) 150 MG PO TB12: ORAL_TABLET | ORAL | 0 refills | Status: DC

## 2023-02-22 NOTE — Telephone Encounter (Signed)
Patient comment: I was told by the pharmacy that this prescription was not able to be filled.

## 2023-03-15 ENCOUNTER — Other Ambulatory Visit: Payer: Self-pay | Admitting: Gastroenterology

## 2023-03-15 NOTE — Progress Notes (Signed)
Patient ID: Karl Davis, male    DOB: Mar 22, 1961, 62 y.o.   MRN: [id number]  Chief Complaint  Patient presents with   ADHD    HPI: ADHD f/u: Medications helping target goals: Wellbutrin 150mg  bid Regimen: bid Medication side effects/concerns: none Weight: stable Sleep: not affected Mood changes: none Tics: denies Blood pressure, Weight, Pulse, Behavior reviewed: wnl   Assessment & Plan:  Attention deficit hyperactivity disorder (ADHD), predominantly inattentive type Assessment & Plan: chronic, stable taking Bupropion 150mg  bid tolerating, no SE sending refill f/u 6mos  Orders: -     buPROPion HCl ER (SR); TAKE 1 TABLET BY MOUTH DAILY FOR 3 DAYS THEN TAKE 1 TABLET BY MOUTH TWICE DAILY  Dispense: 60 tablet; Refill: 5  Immunization due -     Varicella-zoster vaccine IM   Subjective:    Outpatient Medications Prior to Visit  Medication Sig Dispense Refill   acetaminophen (TYLENOL) 500 MG tablet Take 2 tablets (1,000 mg total) by mouth every 6 (six) hours. 30 tablet 0   aspirin EC 81 MG tablet Take 1 tablet (81 mg total) by mouth daily. Swallow whole. 30 tablet 12   famotidine (PEPCID) 20 MG tablet Take 20 mg by mouth daily as needed for heartburn.     ibuprofen (ADVIL) 200 MG tablet Take 600 mg by mouth 2 (two) times daily as needed for mild pain.     pantoprazole (PROTONIX) 40 MG tablet TAKE 1 TABLET(40 MG) BY MOUTH DAILY 30 tablet 2   Tetrahydrozoline HCl (VISINE OP) Place 1 drop into both eyes 2 (two) times daily as needed (dry eyes).     buPROPion (WELLBUTRIN SR) 150 MG 12 hr tablet TAKE 1 TABLET BY MOUTH DAILY FOR 3 DAYS THEN TAKE 1 TABLET BY MOUTH TWICE DAILY 180 tablet 0   loperamide (IMODIUM A-D) 2 MG tablet Take 1 tablet (2 mg total) by mouth 4 (four) times daily as needed for diarrhea or loose stools. 30 tablet 0   meloxicam (MOBIC) 7.5 MG tablet Take 1 tablet (7.5 mg total) by mouth daily. OK to take 1 pill bid or 2 pills at one time if needed. 60  tablet 0   lidocaine (LIDODERM) 5 % Place 1 patch onto the skin daily. Apply to sternum or right chest wall. Remove & Discard patch within 12 hours or as directed by MD (Patient not taking: Reported on 03/16/2023) 15 patch 1   methocarbamol 1000 MG TABS Take 1,000 mg by mouth every 8 (eight) hours as needed for muscle spasms. (Patient not taking: Reported on 03/16/2023) 40 tablet 1   ondansetron (ZOFRAN-ODT) 4 MG disintegrating tablet Take 1 tablet (4 mg total) by mouth every 8 (eight) hours as needed for nausea or vomiting. (Patient not taking: Reported on 03/16/2023) 20 tablet 0   No facility-administered medications prior to visit.   Past Medical History:  Diagnosis Date   Arthritis    Dysphagia 02/01/2022   GERD (gastroesophageal reflux disease)    Numbness and tingling in right hand 08/29/2021   Substance abuse ([facility]) 1981   Past Surgical History:  Procedure Laterality Date   carpal right hand Right 10/17/2021   FRACTURE SURGERY  02/1981   4th metacarpal Left hand   HERNIA REPAIR  1997? 1998?   Rt side   SPINE SURGERY  09/2001   Neck surgery C4/C5   No Known Allergies    Objective:    Physical Exam Vitals and nursing note reviewed.  Constitutional:  General: He is not in acute distress.    Appearance: Normal appearance.  HENT:     Head: Normocephalic.  Cardiovascular:     Rate and Rhythm: Normal rate and regular rhythm.  Pulmonary:     Effort: Pulmonary effort is normal.     Breath sounds: Normal breath sounds.  Musculoskeletal:        General: Normal range of motion.     Cervical back: Normal range of motion.  Skin:    General: Skin is warm and dry.  Neurological:     Mental Status: He is alert and oriented to person, place, and time.  Psychiatric:        Mood and Affect: Mood normal.    BP (!) 154/91 (BP Location: Left Arm, Patient Position: Sitting, Cuff Size: Large)   Pulse 77   Temp 98 F (36.7 C) (Temporal)   Ht 5\' 10"  (1.778 m)   Wt 185 lb 4 oz (84  kg)   SpO2 95%   BMI 26.58 kg/m  Wt Readings from Last 3 Encounters:  03/16/23 185 lb 4 oz (84 kg)  09/06/22 181 lb 8 oz (82.3 kg)  08/30/22 181 lb (82.1 kg)      Dulce Sellar, NP

## 2023-03-16 ENCOUNTER — Ambulatory Visit: Payer: BC Managed Care – PPO | Admitting: Family

## 2023-03-16 VITALS — BP 154/91 | HR 77 | Temp 98.0°F | Ht 70.0 in | Wt 185.2 lb

## 2023-03-16 DIAGNOSIS — N521 Erectile dysfunction due to diseases classified elsewhere: Secondary | ICD-10-CM

## 2023-03-16 DIAGNOSIS — F900 Attention-deficit hyperactivity disorder, predominantly inattentive type: Secondary | ICD-10-CM | POA: Diagnosis not present

## 2023-03-16 DIAGNOSIS — Z23 Encounter for immunization: Secondary | ICD-10-CM

## 2023-03-16 MED ORDER — BUPROPION HCL ER (SR) 150 MG PO TB12: ORAL_TABLET | ORAL | 5 refills | Status: DC

## 2023-03-16 NOTE — Assessment & Plan Note (Addendum)
chronic, stable taking Bupropion 150mg  bid tolerating, no SE sending refill f/u 6mos

## 2023-07-03 ENCOUNTER — Other Ambulatory Visit: Payer: Self-pay | Admitting: Gastroenterology

## 2023-08-30 IMAGING — MR MR CERVICAL SPINE W/O CM
4 of 5 series · 26 of 48 positions shown · non-contrast
Comparison: Cervical spine radiographs 09/06/2021

CLINICAL DATA: Chronic neck pain radiating to bilateral shoulders
and hands with weakness and numbness. History of fusion

EXAM:
MRI CERVICAL SPINE WITHOUT CONTRAST
TECHNIQUE: Multiplanar, multisequence MR imaging of the cervical spine was
performed. No intravenous contrast was administered.

[Series 3: T1 · sagittal · 3.0mm · 0.41mm/px · 6 of 19 slices shown]
[im 1/19]
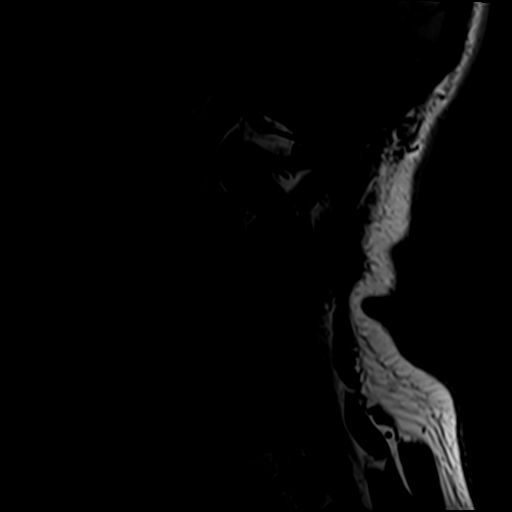
[im 4/19]
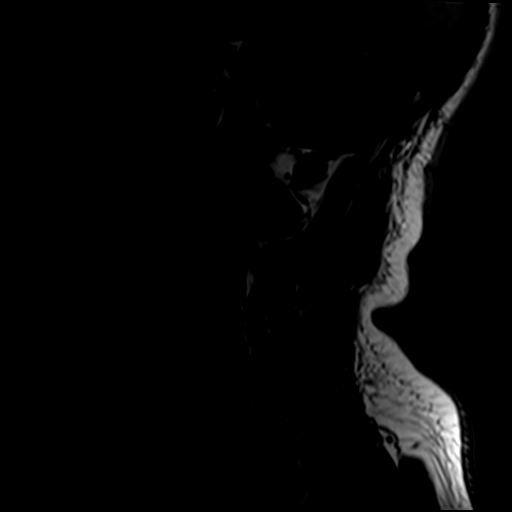
[im 7/19]
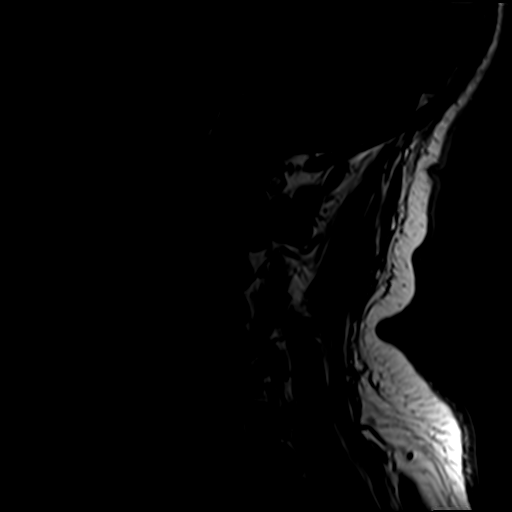
[im 10/19]
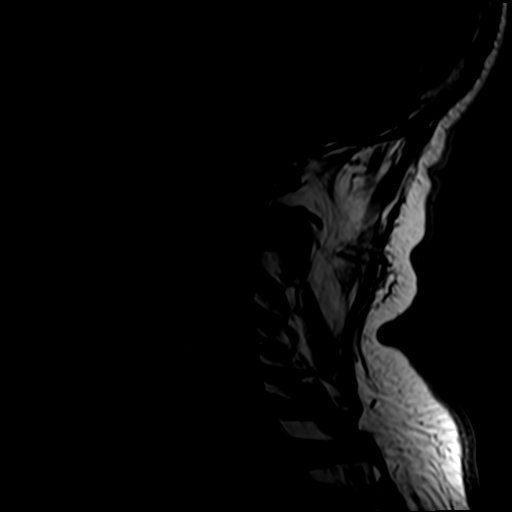
[im 13/19]
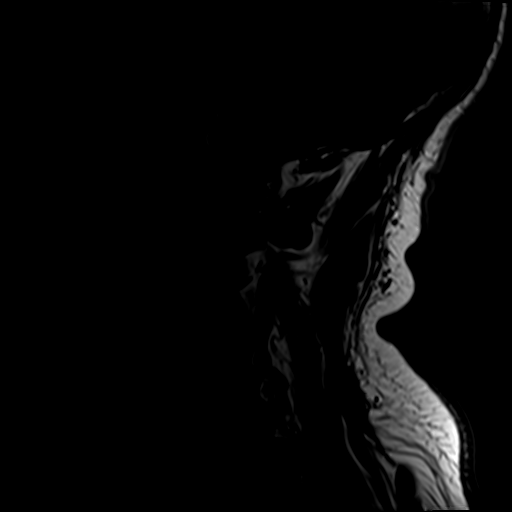
[im 16/19]
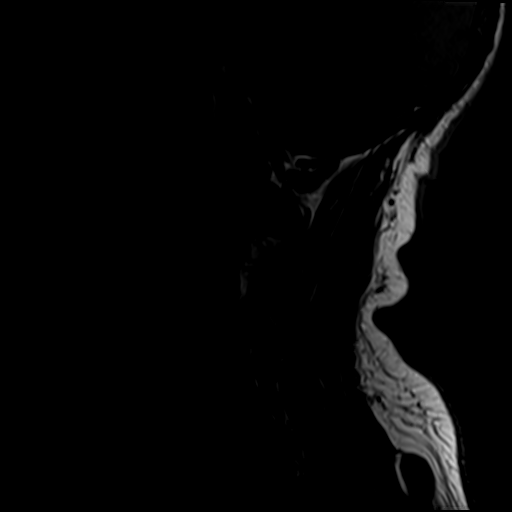

[Series 4: STIR · sagittal · 3.0mm · 0.82mm/px · 3 of 19 slices shown]
[im 4/19]
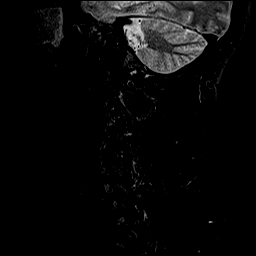
[im 10/19]
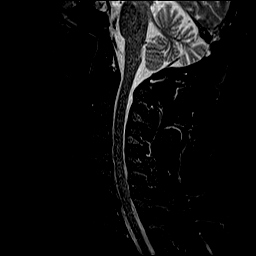
[im 16/19]
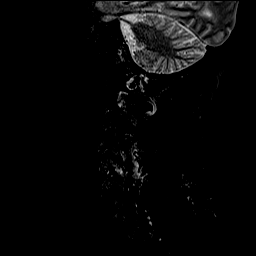

[Series 6: T2 · axial · 3.0mm · 0.70mm/px · z∈[-93,+23]mm · 9 of 32 slices shown (1 of 2)]
[im 1/32]
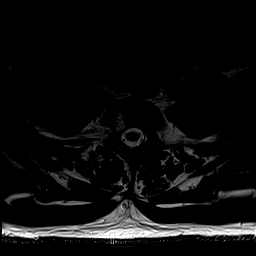
[im 6/32]
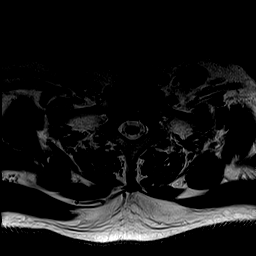
[im 11/32]
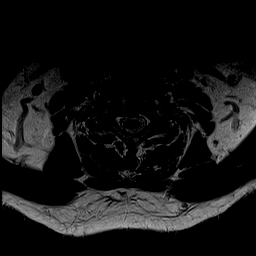
[im 13/32]
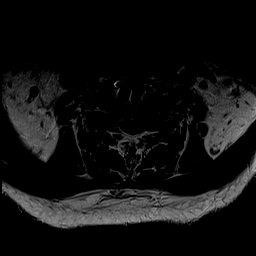
[im 16/32]
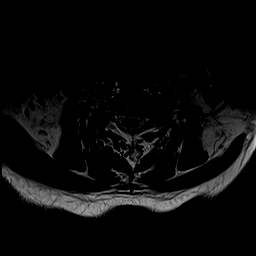
[im 19/32]
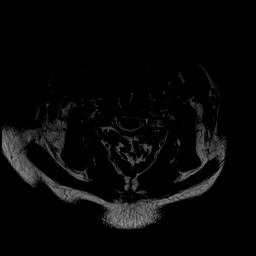
[im 21/32]
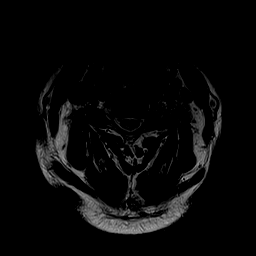
[im 26/32]
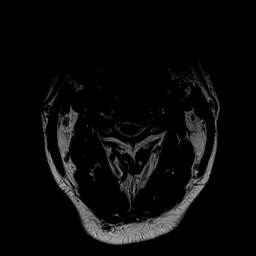
[im 32/32]
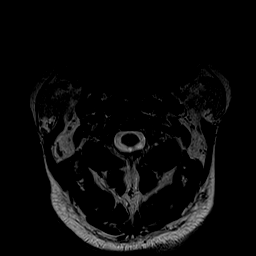

[Series 7: T2 · sagittal · 3.0mm · 0.41mm/px · 8 of 19 slices shown (2 of 2)]
[im 1/19]
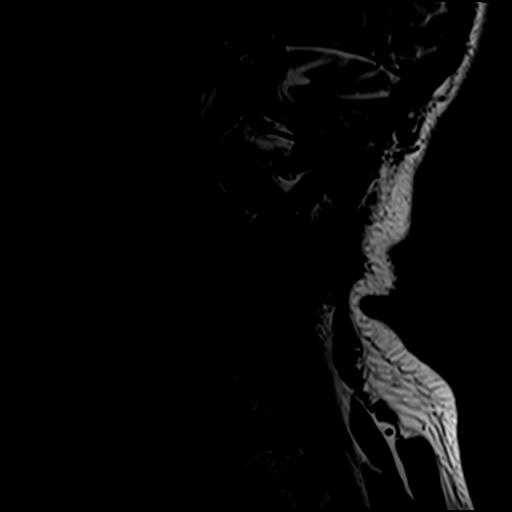
[im 3/19]
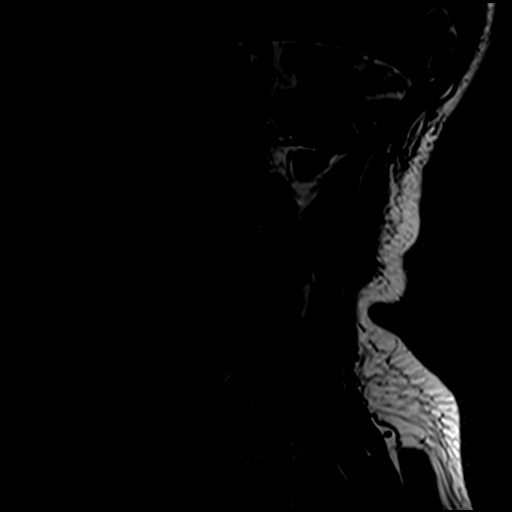
[im 6/19]
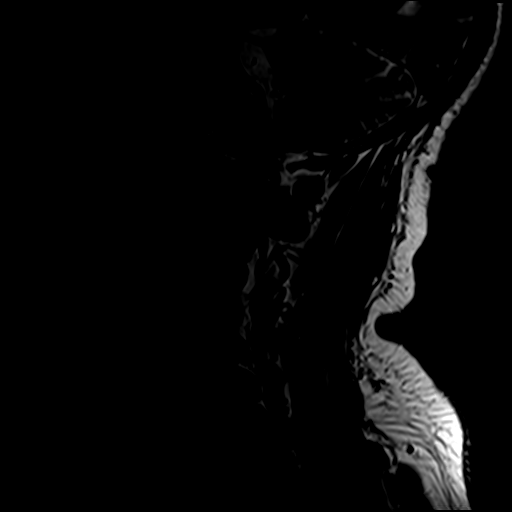
[im 8/19]
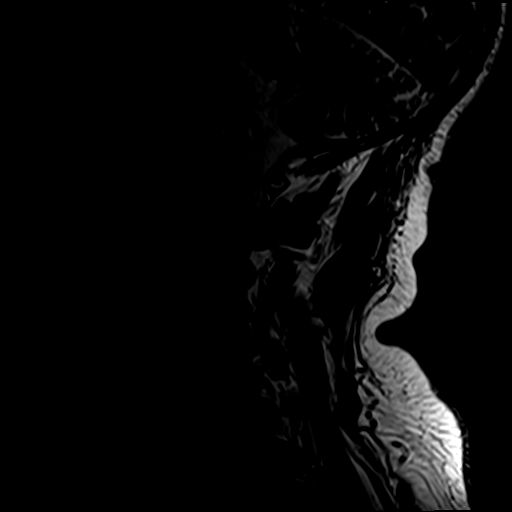
[im 11/19]
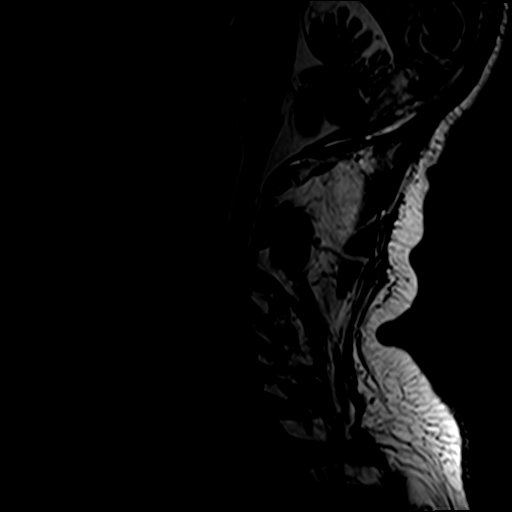
[im 13/19]
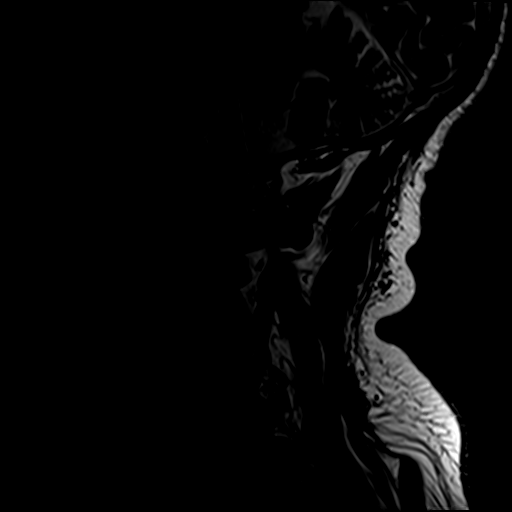
[im 16/19]
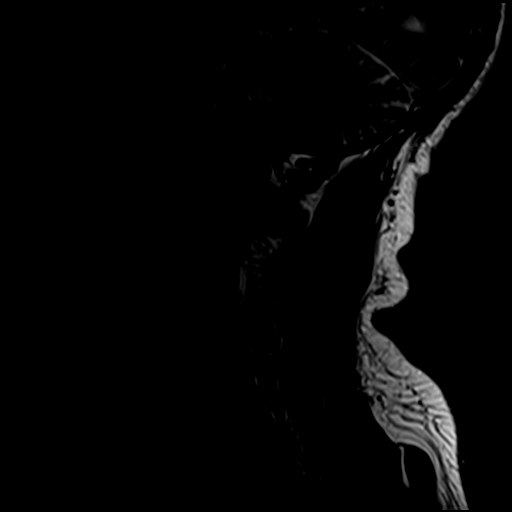
[im 19/19]
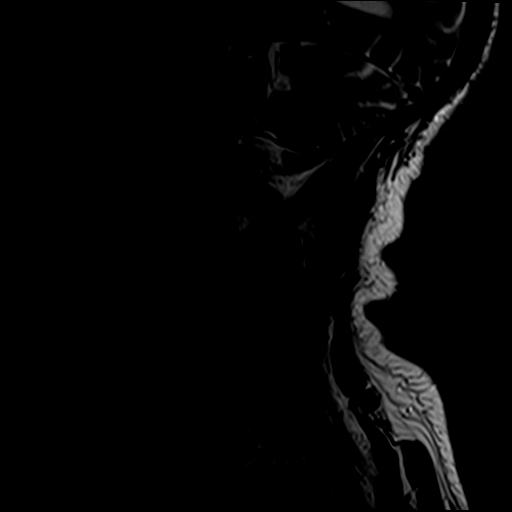

[26 of 48 positions shown; findings below may reference images not displayed]

FINDINGS: Alignment: Normal.

Vertebrae: Patient is status post C5-C6 ACDF. Vertebral body heights
are preserved. Background marrow signal is normal. A T1 and T2
hyperintense lesion in the C3 vertebral body likely reflects a small
benign intraosseous hemangioma. There is mild degenerative endplate
marrow signal abnormality at C6-C7.

There is marrow edema in the left posterior elements at C4-C5,
likely degenerative/reactive. There is no other marrow edema. There
is no suspicious signal abnormality.

Cord: Normal in signal and morphology.

Posterior Fossa, vertebral arteries, paraspinal tissues: There is
mild perifacetal soft tissue edema on the left at C4-C5. The
paraspinal soft tissues are otherwise unremarkable.

The imaged posterior fossa is unremarkable. The vertebral artery
flow voids are normal. There is near-complete opacification of the
right maxillary sinus, incompletely imaged.

Disc levels:

There is disc desiccation and narrowing at C6-C7. The other
nonsurgical disc spaces appear preserved. There are bulky anterior
osteophytes throughout the cervical spine.

C2-C3: No significant spinal canal or neural foraminal stenosis

C3-C4: There is left worse than right uncovertebral ridging and
bilateral facet arthropathy resulting in moderate left and no
significant right neural foraminal stenosis and mild narrowing of
the left aspect of the spinal canal with slight flattening of the
ventral cord

C4-C5: There is mild uncovertebral and facet arthropathy resulting
in moderate left and mild right neural foraminal stenosis without
significant spinal canal stenosis.

C5-C6: Status post ACDF. There is left-sided uncovertebral ridging
and mild facet arthropathy resulting in moderate left and no
significant right neural foraminal stenosis and no significant
spinal canal stenosis

C6-C7: There is a prominent posterior disc osteophyte complex with
bilateral uncovertebral ridging and facet arthropathy resulting in
severe bilateral neural foraminal stenosis and mild spinal canal
stenosis with effacement of the ventral thecal sac

C7-T1: No significant spinal canal or neural foraminal stenosis.
IMPRESSION: 1. Status post C5-C6 ACDF without evidence of complication. Residual
uncovertebral ridging on the left results in moderate left neural
foraminal stenosis. No significant spinal canal or right neural
foraminal stenosis.
2. Adjacent segment disease at C6-C7 resulting in mild spinal canal
stenosis and severe bilateral neural foraminal stenosis.
3. Left worse than right uncovertebral ridging and facet arthropathy
at C3-C4 results in mild narrowing of the left aspect of the spinal
canal with slight flattening of the ventral cord and moderate left
neural foraminal stenosis.
4. Uncovertebral and facet arthropathy at C4-C5 results in moderate
left and mild right neural foraminal stenosis. Additionally, there
is degenerative/reactive marrow edema in the left posterior elements
with mild perifacetal soft tissue edema which could reflect a source
of pain.
5. Near-complete opacification of the right maxillary sinus,
incompletely imaged.

## 2023-09-10 ENCOUNTER — Encounter: Payer: Self-pay | Admitting: Family

## 2023-09-10 ENCOUNTER — Ambulatory Visit (INDEPENDENT_AMBULATORY_CARE_PROVIDER_SITE_OTHER): Payer: BC Managed Care – PPO | Admitting: Family

## 2023-09-10 VITALS — BP 133/84 | HR 67 | Temp 97.3°F | Ht 70.0 in | Wt 189.6 lb

## 2023-09-10 DIAGNOSIS — E7800 Pure hypercholesterolemia, unspecified: Secondary | ICD-10-CM

## 2023-09-10 DIAGNOSIS — Z0000 Encounter for general adult medical examination without abnormal findings: Secondary | ICD-10-CM

## 2023-09-10 DIAGNOSIS — Z23 Encounter for immunization: Secondary | ICD-10-CM

## 2023-09-10 DIAGNOSIS — F900 Attention-deficit hyperactivity disorder, predominantly inattentive type: Secondary | ICD-10-CM

## 2023-09-10 DIAGNOSIS — Z1159 Encounter for screening for other viral diseases: Secondary | ICD-10-CM

## 2023-09-10 DIAGNOSIS — Z8042 Family history of malignant neoplasm of prostate: Secondary | ICD-10-CM | POA: Diagnosis not present

## 2023-09-10 DIAGNOSIS — N521 Erectile dysfunction due to diseases classified elsewhere: Secondary | ICD-10-CM | POA: Diagnosis not present

## 2023-09-10 LAB — PSA: PSA: 3.12 ng/mL (ref 0.10–4.00)

## 2023-09-10 LAB — COMPREHENSIVE METABOLIC PANEL
ALT: 49 U/L (ref 0–53)
AST: 38 U/L — ABNORMAL HIGH (ref 0–37)
Albumin: 4.7 g/dL (ref 3.5–5.2)
Alkaline Phosphatase: 46 U/L (ref 39–117)
BUN: 13 mg/dL (ref 6–23)
CO2: 26 meq/L (ref 19–32)
Calcium: 9.7 mg/dL (ref 8.4–10.5)
Chloride: 104 meq/L (ref 96–112)
Creatinine, Ser: 1 mg/dL (ref 0.40–1.50)
GFR: 79.59 mL/min (ref >60.00)
Glucose, Bld: 113 mg/dL — ABNORMAL HIGH (ref 70–99)
Potassium: 4.4 meq/L (ref 3.5–5.1)
Sodium: 141 meq/L (ref 135–145)
Total Bilirubin: 0.8 mg/dL (ref 0.2–1.2)
Total Protein: 6.9 g/dL (ref 6.0–8.3)

## 2023-09-10 LAB — LIPID PANEL
Cholesterol: 209 mg/dL — ABNORMAL HIGH (ref 0–200)
HDL: 57.1 mg/dL (ref >39.00)
LDL Cholesterol: 135 mg/dL — ABNORMAL HIGH (ref 0–99)
NonHDL: 151.54
Total CHOL/HDL Ratio: 4
Triglycerides: 84 mg/dL (ref 0.0–149.0)
VLDL: 16.8 mg/dL (ref 0.0–40.0)

## 2023-09-10 LAB — CBC WITH DIFFERENTIAL/PLATELET
Basophils Absolute: 0 10*3/uL (ref 0.0–0.1)
Basophils Relative: 0.5 % (ref 0.0–3.0)
Eosinophils Absolute: 0.1 10*3/uL (ref 0.0–0.7)
Eosinophils Relative: 1.5 % (ref 0.0–5.0)
HCT: 48.2 % (ref 39.0–52.0)
Hemoglobin: 16.1 g/dL (ref 13.0–17.0)
Lymphocytes Relative: 20.9 % (ref 12.0–46.0)
Lymphs Abs: 1.2 10*3/uL (ref 0.7–4.0)
MCHC: 33.4 g/dL (ref 30.0–36.0)
MCV: 94.9 fl (ref 78.0–100.0)
Monocytes Absolute: 0.5 10*3/uL (ref 0.1–1.0)
Monocytes Relative: 8.5 % (ref 3.0–12.0)
Neutro Abs: 4.1 10*3/uL (ref 1.4–7.7)
Neutrophils Relative %: 68.6 % (ref 43.0–77.0)
Platelets: 185 10*3/uL (ref 150.0–400.0)
RBC: 5.08 Mil/uL (ref 4.22–5.81)
RDW: 13.2 % (ref 11.5–15.5)
WBC: 6 10*3/uL (ref 4.0–10.5)

## 2023-09-10 MED ORDER — BUPROPION HCL ER (SR) 150 MG PO TB12: ORAL_TABLET | ORAL | 1 refills | Status: DC

## 2023-09-10 MED ORDER — TADALAFIL 5 MG PO TABS: 5.0000 mg | ORAL_TABLET | Freq: Every day | ORAL | 5 refills | Status: DC

## 2023-09-10 NOTE — Progress Notes (Signed)
 Phone: [phone]   Subjective:  Patient 63 y.o. male presenting for annual physical.  Chief Complaint  Patient presents with   Annual Exam    Fasting today    Discussed the use of AI scribe software for clinical note transcription with the patient, who gave verbal consent to proceed.  History of Present Illness   Karl Favila "Elijah Birk" is a 63 year old male who presents for a follow-up visit and physical. Approximately three to four weeks ago, he experienced an episode of weakness and rapid heartbeat after consuming a small breakfast and taking his bupropion along with other medications. During a meeting, he felt the need to sit down and checked his blood pressure, which read approximately 60/90, though he is unsure of the cuff's accuracy. He felt flushed but did not develop a headache. Since then, he has been cautious about eating a larger breakfast and staying hydrated. He mentions experiencing some palpitations and a feeling of tightness in the chest, though he does not describe it as chest pain. No numbness or pain radiating down the arm or face. He is currently taking bupropion and Protonix. He has recently moved and is trying to adjust his physical activity routine. He wants to be more active and mentions living closer to family, including his sons and grandchildren. Patient also complains of ongoing erectile dysfunction.  Onset of dysfunction was over a year ago and was gradual in onset.  Patient states the nature of difficulty is maintaining erection. Full erections occur never. Partial erections occur with intercourse. Libido is not affected. 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 Patient denies history of cardiovascular disease, diabetes mellitus, neurologic disease, and urologic disease. Patient's expectations as to sexual function are to have a full erection during intercourse.  Patient's description of relationship w/partner is ok.  Previous treatment of ED includes none.    See problem oriented charting- ROS- full  review of systems was completed and negative except for: what is noted in HPI above.  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 1998?   Rt side   SPINE SURGERY  09/2001   Neck surgery C4/C5    Family History  Problem Relation Age of Onset   Depression Mother    Diabetes Mother    Prostate cancer Father    Depression Sister    Depression Sister    ADD / ADHD Son     Medications- reviewed and updated Current Outpatient Medications  Medication Sig Dispense Refill   acetaminophen (TYLENOL) 500 MG tablet Take 2 tablets (1,000 mg total) by mouth every  6 (six) hours. 30 tablet 0   aspirin EC 81 MG tablet Take 1 tablet (81 mg total) by mouth daily. Swallow whole. 30 tablet 12   famotidine (PEPCID) 20 MG tablet Take 20 mg by mouth daily as needed for heartburn.     ibuprofen (ADVIL) 200 MG tablet Take 600 mg by mouth 2 (two) times daily as needed for mild pain.     pantoprazole (PROTONIX) 40 MG tablet TAKE 1  TABLET(40 MG) BY MOUTH DAILY 30 tablet 2   Tetrahydrozoline HCl (VISINE OP) Place 1 drop into both eyes 2 (two) times daily as needed (dry eyes).     buPROPion (WELLBUTRIN SR) 150 MG 12 hr tablet Take 1 tablet twice daily. 180 tablet 1   No current facility-administered medications for this visit.    Allergies-reviewed and updated No Known Allergies  Social History   Social History Narrative   Right handed   Two story home   Is currently working   Lives alone   Drinks caffeine   Objective  Objective:  BP 133/84   Pulse 67   Temp (!) 97.3 F (36.3 C) (Temporal)   Ht 5\' 10"  (1.778 m)   Wt 189 lb 9.6 oz (86 kg)   SpO2 98%   BMI 27.20 kg/m  Gen: NAD, resting comfortably HEENT: Mucous membranes are moist. Oropharynx normal Neck: no thyromegaly CV: RRR no murmurs rubs or gallops Lungs: CTAB no crackles, wheeze, rhonchi Abdomen: soft/nontender/nondistended/normal bowel sounds. No rebound or guarding.  Ext: no edema Skin: warm, dry Neuro: grossly normal, moves all extremities   Assessment and Plan   Health Maintenance counseling: 1. Anticipatory guidance: Patient counseled regarding regular dental exams q6 months, eye exams yearly, avoiding smoking and second hand smoke, limiting alcohol to 2 beverages per day.   2. Risk factor reduction:  Advised patient of need for regular exercise and diet rich in fruits and vegetables to reduce risk of heart attack and stroke.    Wt Readings from Last 3 Encounters:  09/10/23 189 lb 9.6 oz (86 kg)  03/16/23 185 lb 4 oz (84 kg)  09/06/22 181 lb 8 oz (82.3 kg)   3. Immunizations/screenings/ancillary studies Immunization History  Administered Date(s) Administered   Hepatitis B, ADULT 03/03/2014   Influenza, Seasonal, Injecte, Preservative Fre 08/14/2013, 09/07/2014, 09/10/2023   Influenza,inj,Quad PF,6+ Mos 09/09/2015, 08/26/2021, 09/06/2022   Influenza-Unspecified 04/14/2010, 07/06/2011, 07/23/2012, 08/14/2013, 09/07/2014   Moderna  Sars-Covid-2 Vaccination 11/12/2019, 12/10/2019   Tdap 03/11/2003, 08/12/2012, 01/05/2022   Zoster Recombinant(Shingrix) 09/06/2022, 03/16/2023   Health Maintenance Due  Topic Date Due   Hepatitis C Screening  Never done    4. Prostate cancer screening >55yo - risk factors?  Lab Results  Component Value Date   PSA 3.12 09/10/2023   PSA 2.59 08/26/2021    5. Colon cancer screening:  yes, not due 6. Skin cancer screening- advised regular sunscreen use. Denies worrisome, changing, or new skin lesions. Pt is established with dermatology.  7. Smoking associated screening (lung cancer screening, AAA screen 65-75, UA)- non- smoker 8. STD screening - N/A  Assessment and Plan    Near Syncope episode - Episode of weakness, tachycardia, and hypotension after taking Bupropion on an empty stomach. No recurrence since ensuring adequate food intake and hydration. -Continue Bupropion with food and adequate hydration. -Advise patient to monitor blood pressure and heart rate at home, and to report any recurrent episodes.  Erectile Dysfunction - Discussed options for treatment including daily or as-needed Cialis. -Start Cialis 5mg   qd, starting with 1/2 pill for up to 2 weeks, then increase to 1 pill qd, with the option to take extra pill as needed 76m-1h prior to intercourse. -F/U in 6 mos or prn    Attention deficit hyperactivity disorder (ADHD), predominantly inattentive type   Relevant Medications   buPROPion (WELLBUTRIN SR) 150 MG 12 hr tablet      Family history of prostate cancer   Relevant Orders   PSA (Completed)   Other Visit Diagnoses       Annual physical exam    -  Primary   Relevant Orders   HIV Antibody (routine testing w rflx)   CBC with Differential/Platelet (Completed)   Comprehensive metabolic panel (Completed)        Flu vaccine need    -   Relevant Orders   Flu vaccine trivalent PF, 6mos and older(Flulaval,Afluria,Fluarix,Fluzone) (Completed)     Elevated LDL  cholesterol level    -         Lipid panel (Completed)      Need for hepatitis C screening test  -   Relevant Orders   Hepatitis C Antibody    Recommended follow up: Return in about 6 months (around 03/12/2024) for med refills, ADHD f/u. Future Appointments  Date Time Provider Department Center  03/12/2024  8:00 AM Dulce Sellar, NP [facility]    Lab/Order associations: non- fasting    Dulce Sellar, NP    Physical Exam

## 2023-09-10 NOTE — Patient Instructions (Addendum)
 It was very nice to see you today!   I will review your lab results via MyChart in a few days.  I have sent over a refill for your Bupropion and I have sent the new ED med, Cialis to take daily. Start with 1/2 pill for 1-2 weeks, then increase to 1 full pill as tolerated. Ok to take an extra 1 pill prn prior to intercourse if needed.   Have a great week!    PLEASE NOTE:  If you had any lab tests please let us know if you have not heard back within a few days. You may see your results on MyChart before we have a chance to review them but we will give you a call once they are reviewed by [place]. If we ordered any referrals today, please let us know if you have not heard from their office within the next week.

## 2023-09-10 NOTE — Assessment & Plan Note (Signed)
chronic, stable taking Bupropion 150mg  bid tolerating, no SE sending refill f/u 6mos

## 2023-09-10 NOTE — Assessment & Plan Note (Signed)
 Discussed options for treatment including daily or as-needed Cialis. -Start Cialis 5mg  qd, starting with 1/2 pill for up to 2 weeks, then increase to 1 pill qd, with the option to take extra pill as needed 79m-1h prior to intercourse. -F/U in 6 mos or prn

## 2023-09-16 ENCOUNTER — Encounter: Payer: Self-pay | Admitting: Family

## 2023-09-20 ENCOUNTER — Telehealth: Payer: Self-pay

## 2023-09-20 ENCOUNTER — Other Ambulatory Visit (HOSPITAL_COMMUNITY): Payer: Self-pay

## 2023-09-20 NOTE — Telephone Encounter (Signed)
 Pharmacy Patient Advocate Encounter   Received notification from CoverMyMeds that prior authorization for Tadalafil 5MG  tablets is required/requested.   Insurance verification completed.   The patient is insured through [employer] .   Per test claim: PA required; PA submitted to above mentioned insurance via CoverMyMeds Key/confirmation #/EOC [id number] Status is pending SENT TO PLAN

## 2023-09-24 NOTE — Telephone Encounter (Signed)
 Pharmacy Patient Advocate Encounter  Received notification from [employer] that Prior Authorization for TADALAFIL has been APPROVED from 08/21/23 to 09/19/24   PA #/Case ID/Reference #: [id number]

## 2023-09-25 NOTE — Telephone Encounter (Signed)
 I called pt in regards to Approval, pt verbalized understanding.

## 2023-10-11 ENCOUNTER — Ambulatory Visit
Admission: RE | Admit: 2023-10-11 | Discharge: 2023-10-11 | Disposition: A | Discharge status: Home / Self Care | Source: Ambulatory Visit | Attending: Emergency Medicine | Admitting: Emergency Medicine

## 2023-10-11 VITALS — BP 160/98 | HR 76 | Temp 98.8°F | Resp 16 | Ht 70.0 in | Wt 185.0 lb

## 2023-10-11 DIAGNOSIS — M79672 Pain in left foot: Secondary | ICD-10-CM | POA: Diagnosis not present

## 2023-10-11 DIAGNOSIS — B351 Tinea unguium: Secondary | ICD-10-CM

## 2023-10-11 DIAGNOSIS — R030 Elevated blood-pressure reading, without diagnosis of hypertension: Secondary | ICD-10-CM

## 2023-10-11 DIAGNOSIS — L03116 Cellulitis of left lower limb: Secondary | ICD-10-CM

## 2023-10-11 MED ORDER — CEPHALEXIN 500 MG PO CAPS: 500.0000 mg | ORAL_CAPSULE | Freq: Four times a day (QID) | ORAL | 0 refills | Status: AC

## 2023-10-11 NOTE — Discharge Instructions (Addendum)
 Rest,ice,elevate,take antibiotic as directed. May take tylenol for pain as label directed If you develop new or worsening issues or concerns(fever,increased swelling,redness,pain,etc) go to ER immediately for further evaluation. Please follow up with PCP/podiatrist regarding toenail fungal infection, recheck BP as it was elevated.

## 2023-10-11 NOTE — ED Triage Notes (Signed)
 Pt c/o L foot pain x5 days. States foot is swollen and has not responded to ice, NSAIDS, etc. for a couple days. - Entered by patient

## 2023-10-11 NOTE — ED Provider Notes (Signed)
 [facility]    CSN: [id number] Arrival date & time: 10/11/23  1115      History   Chief Complaint Chief Complaint  Patient presents with   Foot Pain    Appt    HPI Karl Davis is a 63 y.o. male.   63 year old male pt, Karl Davis, presents to urgent care for left foot pain x 5 days. Pt denies any known trauma or injury, does report break in skin to left lateral aspect of foot(unknown cause), + swelling to top of left foot/ankle.Pt has been resting,elevating,ice,NSAIDS without relief  Pt denies hx of gout  PMH: Arthritis, GERD, substance abuse  The history is provided by the patient. No language interpreter was used.    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 1998?   Rt side   SPINE SURGERY  09/2001   Neck surgery C4/C5       Home Medications    Prior to Admission medications   Medication Sig Start Date End  Date Taking? Authorizing Provider  acetaminophen (TYLENOL) 500 MG tablet Take 2 tablets (1,000 mg total) by mouth every 6 (six) hours. 01/26/22  Yes Adam Phenix, PA-C  aspirin EC 81 MG tablet Take 1 tablet (81 mg total) by mouth daily. Swallow whole. 01/26/22  Yes Simaan, Francine Graven, PA-C  buPROPion (WELLBUTRIN SR) 150 MG 12 hr tablet Take 1 tablet twice daily. 09/10/23  Yes Hudnell, Judeth Cornfield, NP  cephALEXin (KEFLEX) 500 MG capsule Take 1 capsule (500 mg total) by mouth 4 (four) times daily for 7 days. 10/11/23 10/18/23 Yes Sharin Altidor, Para March, NP  famotidine (PEPCID) 20 MG tablet Take 20 mg by mouth daily as needed for heartburn. 01/22/08  Yes [provider]  ibuprofen (ADVIL) 200 MG tablet Take 600 mg by mouth 2 (two) times daily as needed for mild pain. 01/22/08  Yes [provider]  pantoprazole (PROTONIX) 40 MG tablet TAKE 1 TABLET(40 MG) BY MOUTH DAILY 07/05/23  Yes Nandigam, Kavitha V, MD  tadalafil (CIALIS) 5 MG tablet Take 1 tablet (5 mg total) by mouth daily. START with 1/2 pill for 1-2 weeks, then increase to 1 pill qd if tolerated. OK to take extra pill prn 59min-1hr prior to intercourse if needed. 09/10/23  Yes Dulce Sellar, NP  Tetrahydrozoline HCl (VISINE OP) Place 1 drop into both eyes 2 (two) times daily as needed (dry eyes).   Yes [provider]    Family  History Family History  Problem Relation Age of Onset   Depression Mother    Diabetes Mother    Prostate cancer Father    Depression Sister    Depression Sister    ADD / ADHD Son     Social History Social History   Tobacco Use   Smoking status: Former    Types: Gaffer exposure: Yes   Smokeless tobacco: Former  Building services engineer status: Never Used  Substance Use Topics   Alcohol use: Not Currently   Drug use: Not Currently    Types: Amphetamines, "Crack" cocaine, Marijuana, LSD     Allergies   Patient has no known allergies.   Review of Systems Review of  Systems  Constitutional:  Negative for fever.  Skin:  Positive for color change and wound.  All other systems reviewed and are negative.    Physical Exam Triage Vital Signs ED Triage Vitals  Encounter Vitals Group     BP 10/11/23 1127 (!) 160/98     Systolic BP Percentile --      Diastolic BP Percentile --      Pulse Rate 10/11/23 1127 76     Resp 10/11/23 1127 16     Temp 10/11/23 1127 98.8 F (37.1 C)     Temp Source 10/11/23 1127 Oral     SpO2 10/11/23 1127 97 %     Weight 10/11/23 1126 185 lb (83.9 kg)     Height 10/11/23 1126 5\' 10"  (1.778 m)     Head Circumference --      Peak Flow --      Pain Score 10/11/23 1129 7     Pain Loc --      Pain Education --      Exclude from Growth Chart --    No data found.  Updated Vital Signs BP (!) 160/98 (BP Location: Left Arm)   Pulse 76   Temp 98.8 F (37.1 C) (Oral)   Resp 16   Ht 5\' 10"  (1.778 m)   Wt 185 lb (83.9 kg)   SpO2 97%   BMI 26.54 kg/m   Visual Acuity Right Eye Distance:   Left Eye Distance:   Bilateral Distance:    Right Eye Near:   Left Eye Near:    Bilateral Near:     Physical Exam Vitals and nursing note reviewed.  Constitutional:      Appearance: He is well-developed and well-groomed.  HENT:     Head: Normocephalic.  Cardiovascular:     Rate and Rhythm: Normal rate and regular rhythm.     Pulses:          Dorsalis pedis pulses are 2+ on the left side.     Heart sounds: Normal heart sounds.  Musculoskeletal:       Feet:  Feet:     Left foot:     Skin integrity: Erythema and warmth present.     Toenail Condition: Fungal disease present. Neurological:     General: No focal deficit present.     Mental Status: He is alert and oriented to person, place, and time.     GCS: GCS eye subscore is 4. GCS verbal subscore is 5. GCS motor subscore is 6.     Cranial Nerves: No cranial nerve deficit.     Sensory: No sensory deficit.  Psychiatric:        Attention and Perception: Attention normal.  Mood and Affect: Mood normal.        Speech: Speech normal.        Behavior: Behavior normal. Behavior is cooperative.      UC Treatments / Results  Labs (all labs ordered are listed, but only abnormal results are displayed) Labs Reviewed - No data to display  EKG   Radiology No results found.  Procedures Procedures (including critical care time)  Medications Ordered in UC Medications - No data to display  Initial Impression / Assessment and Plan / UC Course  I have reviewed the triage vital signs and the nursing notes.  Pertinent labs & imaging results that were available during my care of the patient were reviewed by me and considered in my medical decision making (see chart for details).    Discussed exam findings and plan of care with patient, scripted Kefelx for cellulitis coverage, rest,elevate, strict go to ER precautions given.   Patient verbalized understanding to this provider.  Ddx: Cellulitis, gout, insect bite, arthritis Final Clinical Impressions(s) / UC Diagnoses   Final diagnoses:  Foot pain, left  Cellulitis of left lower extremity  Toenail fungus  Elevated blood pressure reading     Discharge Instructions      Rest,ice,elevate,take antibiotic as directed. May take tylenol for pain as label directed If you develop new or worsening issues or concerns(fever,increased swelling,redness,pain,etc) go to ER immediately for further evaluation. Please follow up with PCP/podiatrist regarding toenail fungal infection, recheck BP as it was elevated.     ED Prescriptions     Medication Sig Dispense Auth. Provider   cephALEXin (KEFLEX) 500 MG capsule Take 1 capsule (500 mg total) by mouth 4 (four) times daily for 7 days. 28 capsule Malerie Eakins, Para March, NP      PDMP not reviewed this encounter.   Clancy Gourd, NP 10/11/23 1252

## 2023-11-29 ENCOUNTER — Other Ambulatory Visit: Payer: Self-pay | Admitting: Gastroenterology

## 2023-11-29 ENCOUNTER — Encounter: Payer: Self-pay | Admitting: Family

## 2023-11-29 DIAGNOSIS — N521 Erectile dysfunction due to diseases classified elsewhere: Secondary | ICD-10-CM

## 2023-11-29 DIAGNOSIS — N401 Enlarged prostate with lower urinary tract symptoms: Secondary | ICD-10-CM

## 2023-11-30 ENCOUNTER — Other Ambulatory Visit: Payer: Self-pay

## 2023-11-30 DIAGNOSIS — K219 Gastro-esophageal reflux disease without esophagitis: Secondary | ICD-10-CM

## 2023-11-30 MED ORDER — PANTOPRAZOLE SODIUM 40 MG PO TBEC: 40.0000 mg | DELAYED_RELEASE_TABLET | Freq: Every day | ORAL | 2 refills | Status: DC

## 2023-12-04 ENCOUNTER — Other Ambulatory Visit: Payer: Self-pay | Admitting: Family

## 2023-12-04 DIAGNOSIS — K219 Gastro-esophageal reflux disease without esophagitis: Secondary | ICD-10-CM

## 2023-12-04 MED ORDER — PANTOPRAZOLE SODIUM 40 MG PO TBEC: 40.0000 mg | DELAYED_RELEASE_TABLET | Freq: Every day | ORAL | 3 refills | Status: DC

## 2023-12-05 MED ORDER — TADALAFIL 5 MG PO TABS: 5.0000 mg | ORAL_TABLET | Freq: Every day | ORAL | 1 refills | Status: DC

## 2023-12-05 NOTE — Addendum Note (Signed)
 Addended by: Windsor Goeken on: 12/05/2023 04:14 PM   Modules accepted: Orders

## 2023-12-06 ENCOUNTER — Other Ambulatory Visit: Payer: Self-pay

## 2023-12-06 DIAGNOSIS — N521 Erectile dysfunction due to diseases classified elsewhere: Secondary | ICD-10-CM

## 2023-12-06 MED ORDER — TADALAFIL 5 MG PO TABS: 5.0000 mg | ORAL_TABLET | Freq: Every day | ORAL | 1 refills | Status: DC

## 2024-02-13 ENCOUNTER — Other Ambulatory Visit: Payer: Self-pay | Admitting: Family

## 2024-02-13 DIAGNOSIS — F900 Attention-deficit hyperactivity disorder, predominantly inattentive type: Secondary | ICD-10-CM

## 2024-03-12 ENCOUNTER — Ambulatory Visit: Admitting: Family

## 2024-03-12 NOTE — Progress Notes (Deleted)
   Patient ID: Karl Davis, male    DOB: December 20, 1960, 63 y.o.   MRN: [id number]  No chief complaint on file.    Assessment & Plan:   Subjective:    Outpatient Medications Prior to Visit  Medication Sig Dispense Refill   acetaminophen  (TYLENOL ) 500 MG tablet Take 2 tablets (1,000 mg total) by mouth every 6 (six) hours. 30 tablet 0   aspirin  EC 81 MG tablet Take 1 tablet (81 mg total) by mouth daily. Swallow whole. 30 tablet 12   buPROPion  (WELLBUTRIN  SR) 150 MG 12 hr tablet TAKE 1 TABLET BY MOUTH DAILY FOR 3 DAYS THEN TAKE 1 TABLET BY MOUTH TWICE DAILY 180 tablet 1   famotidine (PEPCID) 20 MG tablet Take 20 mg by mouth daily as needed for heartburn.     ibuprofen (ADVIL) 200 MG tablet Take 600 mg by mouth 2 (two) times daily as needed for mild pain.     pantoprazole  (PROTONIX ) 40 MG tablet Take 1 tablet (40 mg total) by mouth daily. 90 tablet 3   tadalafil  (CIALIS ) 5 MG tablet Take 1 tablet (5 mg total) by mouth daily. 90 tablet 1   Tetrahydrozoline HCl (VISINE OP) Place 1 drop into both eyes 2 (two) times daily as needed (dry eyes).     No facility-administered medications prior to visit.   Past Medical History:  Diagnosis Date   Arthritis    Dysphagia 02/01/2022   GERD (gastroesophageal reflux disease)    Numbness and tingling in right hand 08/29/2021   Substance abuse (HCC) 1981   Past Surgical History:  Procedure Laterality Date   carpal right hand Right 10/17/2021   FRACTURE SURGERY  02/1981   4th metacarpal Left hand   HERNIA REPAIR  1997? 1998?   Rt side   SPINE SURGERY  09/2001   Neck surgery C4/C5   No Known Allergies    Objective:    Physical Exam Vitals and nursing note reviewed.  Constitutional:      General: He is not in acute distress.    Appearance: Normal appearance.  HENT:     Head: Normocephalic.  Cardiovascular:     Rate and Rhythm: Normal rate and regular rhythm.  Pulmonary:     Effort: Pulmonary effort is normal.     Breath sounds:  Normal breath sounds.  Musculoskeletal:        General: Normal range of motion.     Cervical back: Normal range of motion.  Skin:    General: Skin is warm and dry.  Neurological:     Mental Status: He is alert and oriented to person, place, and time.  Psychiatric:        Mood and Affect: Mood normal.    There were no vitals taken for this visit. Wt Readings from Last 3 Encounters:  10/11/23 185 lb (83.9 kg)  09/10/23 189 lb 9.6 oz (86 kg)  03/16/23 185 lb 4 oz (84 kg)       Lucius Krabbe, NP

## 2024-03-27 ENCOUNTER — Ambulatory Visit: Admitting: Family

## 2024-03-27 ENCOUNTER — Encounter: Payer: Self-pay | Admitting: Family

## 2024-03-27 VITALS — BP 144/98 | HR 71 | Temp 98.0°F | Ht 70.0 in | Wt 195.4 lb

## 2024-03-27 DIAGNOSIS — F900 Attention-deficit hyperactivity disorder, predominantly inattentive type: Secondary | ICD-10-CM | POA: Diagnosis not present

## 2024-03-27 DIAGNOSIS — I10 Essential (primary) hypertension: Secondary | ICD-10-CM | POA: Diagnosis not present

## 2024-03-27 DIAGNOSIS — M4722 Other spondylosis with radiculopathy, cervical region: Secondary | ICD-10-CM

## 2024-03-27 DIAGNOSIS — K219 Gastro-esophageal reflux disease without esophagitis: Secondary | ICD-10-CM

## 2024-03-27 DIAGNOSIS — E782 Mixed hyperlipidemia: Secondary | ICD-10-CM

## 2024-03-27 DIAGNOSIS — Z1159 Encounter for screening for other viral diseases: Secondary | ICD-10-CM

## 2024-03-27 LAB — LIPID PANEL
Cholesterol: 198 mg/dL (ref 0–200)
HDL: 57.3 mg/dL (ref >39.00)
LDL Cholesterol: 123 mg/dL — ABNORMAL HIGH (ref 0–99)
NonHDL: 140.54
Total CHOL/HDL Ratio: 3
Triglycerides: 89 mg/dL (ref 0.0–149.0)
VLDL: 17.8 mg/dL (ref 0.0–40.0)

## 2024-03-27 LAB — BASIC METABOLIC PANEL WITH GFR
BUN: 12 mg/dL (ref 6–23)
CO2: 28 meq/L (ref 19–32)
Calcium: 10.1 mg/dL (ref 8.4–10.5)
Chloride: 103 meq/L (ref 96–112)
Creatinine, Ser: 1 mg/dL (ref 0.40–1.50)
GFR: 79.28 mL/min (ref >60.00)
Glucose, Bld: 121 mg/dL — ABNORMAL HIGH (ref 70–99)
Potassium: 4.5 meq/L (ref 3.5–5.1)
Sodium: 139 meq/L (ref 135–145)

## 2024-03-27 MED ORDER — BUPROPION HCL ER (SR) 150 MG PO TB12
ORAL_TABLET | ORAL | 1 refills | Status: AC
Start: 2024-03-27 — End: ?

## 2024-03-27 MED ORDER — PANTOPRAZOLE SODIUM 40 MG PO TBEC: 40.0000 mg | DELAYED_RELEASE_TABLET | Freq: Every day | ORAL | 1 refills | Status: AC

## 2024-03-27 NOTE — Patient Instructions (Signed)
 It was very nice to see you today!   Your blood pressure is elevated. See the handout attached which reinforces some of what we talked about today. Try to lose weight, even 5lbs can make a difference. Increase water to 2.5 liters daily. Increase cardio exercise at least 20 minutes as many days as able. Eat a low sodium diet.  Please schedule a 4 week follow up visit for a recheck.      PLEASE NOTE:  If you had any lab tests please let us  know if you have not heard back within a few days. You may see your results on MyChart before we have a chance to review them but we will give you a call once they are reviewed by us . If we ordered any referrals today, please let us  know if you have not heard from their office within the next week.

## 2024-03-27 NOTE — Progress Notes (Unsigned)
 Patient ID: Karl Davis, male    DOB: June 09, 1961, 63 y.o.   MRN: [id number]  Chief Complaint  Patient presents with  . ADHD    F/u  . Hypertension  . Gastroesophageal Reflux  Discussed the use of AI scribe software for clinical note transcription with the patient, who gave verbal consent to proceed.  History of Present Illness   Karl Davis is a 63 year old male who presents for medication management and blood pressure monitoring.  Elevated blood pressure and weight gain - Elevated blood pressure readings recently - No current antihypertensive medication - Prefers lifestyle modifications for blood pressure management - Recent weight gain  Attention deficit hyperactivity disorder (adhd) and medication side effects - Manages ADHD with bupropion , taken twice daily - Occasionally reduces bupropion  to one dose due to jitteriness  Headache and neurological symptoms - Occasional headaches - Intermittent 'full-headed feeling' - No dizziness  Anxiety - Increased anxiety related to changes in work environment  Gastroesophageal reflux and indigestion - Takes pantoprazole  for indigestion and reflux - Adequate supply of pantoprazole  refills  Ocular symptoms - Uses eye drops for red eyes, possibly related to allergies  Cervical osteophytes and associated symptoms - Osteophytes at C3 causing throat discomfort - Coughing and difficulty breathing when bending over    Assessment and Plan    Attention-deficit hyperactivity disorder, predominantly inattentive type ADHD managed with bupropion . Experiences occasional jitteriness, suggesting possible dose adjustment needed. - Consider reducing bupropion  dose or splitting the dose if jitteriness persists.  Gastroesophageal reflux disease GERD managed with pantoprazole .  Hypertension Blood pressure elevated today. Prefers lifestyle modifications over medication. Discussed risks of elevated blood pressure causing  micro damage to kidneys, eyes, and heart. - Recheck blood pressure in 4 weeks. - Encourage weight loss. - Advise on low sodium diet. - Increase water intake to 2.5 liters daily. - Monitor blood pressure at home with wrist cuff. - Consider bringing wrist cuff to next appointment for calibration.  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 Symptoms may be worsening. Previous surgical consultation discussed. - Contact previous specialist regarding need for referral. - Consider surgical evaluation if symptoms persist.  Snoring, possible sleep apnea Increased snoring noted. Possible sleep apnea considered. Cervical osteophytes may contribute to snoring and airway obstruction. - Consider sleep study if symptoms suggestive of sleep apnea persist.      Subjective:    Outpatient Medications Prior to Visit  Medication Sig Dispense Refill  . acetaminophen  (TYLENOL ) 500 MG tablet Take 2 tablets (1,000 mg total) by mouth every 6 (six) hours. 30 tablet 0  . aspirin  EC 81 MG tablet Take 1 tablet (81 mg total) by mouth daily. Swallow whole. 30 tablet 12  . famotidine (PEPCID) 20 MG tablet Take 20 mg by mouth daily as needed for heartburn.    SABRA ibuprofen (ADVIL) 200 MG tablet Take 600 mg by mouth 2 (two) times daily as needed for mild pain.    . tadalafil  (CIALIS ) 5 MG tablet Take 1 tablet (5 mg total) by mouth daily. 90 tablet 1  . Tetrahydrozoline HCl (VISINE OP) Place 1 drop into both eyes 2 (two) times daily as needed (dry eyes).    . buPROPion  (WELLBUTRIN  SR) 150 MG 12 hr tablet TAKE 1 TABLET BY MOUTH DAILY FOR 3 DAYS THEN TAKE 1 TABLET BY MOUTH TWICE DAILY 180 tablet 1  . pantoprazole  (PROTONIX ) 40 MG tablet Take 1 tablet (40 mg total) by  mouth daily. 90 tablet 3   No facility-administered medications prior to visit.   Past Medical History:  Diagnosis Date  . Arthritis   . Dysphagia 02/01/2022  . GERD  (gastroesophageal reflux disease)   . Numbness and tingling in right hand 08/29/2021  . Substance abuse (HCC) 1981   Past Surgical History:  Procedure Laterality Date  . carpal right hand Right 10/17/2021  . FRACTURE SURGERY  02/1981   4th metacarpal Left hand  . HERNIA REPAIR  1997? 1998?   Rt side  . SPINE SURGERY  09/2001   Neck surgery C4/C5   No Known Allergies    Objective:    Physical Exam Vitals and nursing note reviewed.  Constitutional:      General: He is not in acute distress.    Appearance: Normal appearance.  HENT:     Head: Normocephalic.  Cardiovascular:     Rate and Rhythm: Normal rate and regular rhythm.  Pulmonary:     Effort: Pulmonary effort is normal.     Breath sounds: Normal breath sounds.  Musculoskeletal:        General: Normal range of motion.     Cervical back: Normal range of motion.  Skin:    General: Skin is warm and dry.  Neurological:     Mental Status: He is alert and oriented to person, place, and time.  Psychiatric:        Mood and Affect: Mood normal.    BP (!) 155/93 (BP Location: Left Arm, Patient Position: Sitting, Cuff Size: Large)   Pulse 71   Temp 98 F (36.7 C) (Temporal)   Ht 5' 10 (1.778 m)   Wt 195 lb 6 oz (88.6 kg)   SpO2 97%   BMI 28.03 kg/m  Wt Readings from Last 3 Encounters:  03/27/24 195 lb 6 oz (88.6 kg)  10/11/23 185 lb (83.9 kg)  09/10/23 189 lb 9.6 oz (86 kg)     Lucius Krabbe, NP

## 2024-03-28 LAB — HEPATITIS C ANTIBODY: Hepatitis C Ab: NONREACTIVE

## 2024-03-28 NOTE — Assessment & Plan Note (Signed)
 Cervical osteophytes causing dysphagia and possible airway obstruction. Symptoms may be worsening. Previous surgical consultation discussed. - Contact previous specialist regarding need for referral. - Consider surgical evaluation if symptoms persist.

## 2024-03-28 NOTE — Assessment & Plan Note (Signed)
-   GERD managed with pantoprazole  40mg  qd, sending refill. - F/U in 6 mos

## 2024-03-28 NOTE — Assessment & Plan Note (Signed)
 Blood pressure elevated today. Prefers lifestyle modifications over medication. Discussed risks of elevated blood pressure causing daily microvascular damage to kidneys, eyes, and heart. - Recheck blood pressure in 4 weeks. - Encourage weight loss. - Advised on low sodium diet. - Increase water intake to 2.5 liters daily. - Monitor blood pressure at home with wrist cuff. - Consider bringing wrist cuff to next appointment for calibration.

## 2024-03-28 NOTE — Assessment & Plan Note (Signed)
 Last levels checked 09/2023, medication recommended, pt chose to try lifestyle choices. TC 209, LDL 135. - Recheck lipid panel today - Encourage low dose Crestor, 3d/week can offer benefit. - Continue low cholesterol diet, reduce red meat & high fat dairy. - F/U in 1 year.

## 2024-03-28 NOTE — Assessment & Plan Note (Signed)
 ADHD managed with bupropion . Experiences occasional jitteriness, suggesting possible dose adjustment needed. - Consider reducing bupropion  dose or splitting the dose if jitteriness persists. - Sending refill today - F/U in 6 mos

## 2024-03-30 ENCOUNTER — Ambulatory Visit: Payer: Self-pay | Admitting: Family

## 2024-04-28 ENCOUNTER — Ambulatory Visit: Admitting: Family

## 2024-04-28 VITALS — BP 156/90 | HR 73 | Temp 98.3°F | Ht 70.0 in | Wt 196.2 lb

## 2024-04-28 DIAGNOSIS — E782 Mixed hyperlipidemia: Secondary | ICD-10-CM | POA: Diagnosis not present

## 2024-04-28 DIAGNOSIS — I10 Essential (primary) hypertension: Secondary | ICD-10-CM

## 2024-04-28 MED ORDER — LISINOPRIL 10 MG PO TABS: 10.0000 mg | ORAL_TABLET | Freq: Every day | ORAL | 1 refills | Status: DC

## 2024-04-28 MED ORDER — ROSUVASTATIN CALCIUM 5 MG PO TABS: 5.0000 mg | ORAL_TABLET | ORAL | 1 refills | Status: DC

## 2024-04-28 NOTE — Progress Notes (Signed)
 Patient ID: Karl Davis, male    DOB: 17-Jan-1961, 63 y.o.   MRN: [id number]  Chief Complaint  Patient presents with   Hypertension  Discussed the use of AI scribe software for clinical note transcription with the patient, who gave verbal consent to proceed.  History of Present Illness Karl Davis is a 63 year old male with hypertension who presents for a follow-up on elevated blood pressure.  Four weeks ago, his blood pressure was elevated. Recent home readings are 156/93, 158/90, and 169/84, with a previous reading of 157/97. He has gained one pound since the last visit. He is attempting weight loss as part of lifestyle modifications. He is not on any antihypertensive medication, having initially opted for lifestyle changes. Home blood pressure readings are consistent with office measurements, without significant fluctuations. Per last labs, recommended starting a statin at least weekly or 3d/week. Would like to discuss today. Last lipid panel as follows: Lab Results  Component Value Date   CHOL 198 03/27/2024   HDL 57.30 03/27/2024   LDLCALC 123 (H) 03/27/2024   TRIG 89.0 03/27/2024   CHOLHDL 3 03/27/2024   The 10-year ASCVD risk score (Arnett DK, et al., 2019) is: 18.8%   Values used to calculate the score:     Age: 63 years     Clincally relevant sex: Male     Is Non-Hispanic African American: No     Diabetic: No     Tobacco smoker: No     Systolic Blood Pressure: 156 mmHg     Is BP treated: Yes     HDL Cholesterol: 57.3 mg/dL     Total Cholesterol: 198 mg/dL Assessment & Plan Essential hypertension Blood pressure remains elevated at 156/93 mmHg, risking microvascular damage. - Prescribe lisinopril 10 mg once daily in the morning. - Discuss potential side effects: dry cough, angioedema, lightheadedness. - Advise blood pressure monitoring 1-2 times daily, one day in am, next day in pm, repeat for 1 week and notify office if readings are still  >135/90. - Report readings via MyChart or phone. - Schedule follow-up in two months to reassess blood pressure and kidney function.  Hyperlipidemia Elevated risk of heart attack or stroke; medication initiation recommended, statins are most effective at lowering elevated LDL along with lifestyle changes. - Prescribe rosuvastatin 5mg  three times a week on Monday, Wednesday, and Friday. - Possible side effect of myalgia, less noticeable with spread out doses. - Continue to work on reducing saturated fat in diet and increase cardio exercise as able. - Recheck cholesterol levels in two to three months.   Subjective:    Outpatient Medications Prior to Visit  Medication Sig Dispense Refill   acetaminophen  (TYLENOL ) 500 MG tablet Take 2 tablets (1,000 mg total) by mouth every 6 (six) hours. 30 tablet 0   aspirin  EC 81 MG tablet Take 1 tablet (81 mg total) by mouth daily. Swallow whole. 30 tablet 12   buPROPion  (WELLBUTRIN  SR) 150 MG 12 hr tablet TAKE 1 TABLET BY MOUTH DAILY FOR 3 DAYS THEN TAKE 1 TABLET BY MOUTH TWICE DAILY 180 tablet 1   famotidine (PEPCID) 20 MG tablet Take 20 mg by mouth daily as needed for heartburn.     ibuprofen (ADVIL) 200 MG tablet Take 600 mg by mouth 2 (two) times daily as needed for mild pain.     pantoprazole  (PROTONIX ) 40 MG tablet Take 1 tablet (40 mg total) by mouth daily. 90 tablet 1   tadalafil  (CIALIS ) 5  MG tablet Take 1 tablet (5 mg total) by mouth daily. 90 tablet 1   Tetrahydrozoline HCl (VISINE OP) Place 1 drop into both eyes 2 (two) times daily as needed (dry eyes).     No facility-administered medications prior to visit.   Past Medical History:  Diagnosis Date   Arthritis    Dysphagia 02/01/2022   GERD (gastroesophageal reflux disease)    Numbness and tingling in right hand 08/29/2021   Substance abuse (HCC) 1981   Past Surgical History:  Procedure Laterality Date   carpal right hand Right 10/17/2021   FRACTURE SURGERY  02/1981   4th metacarpal  Left hand   HERNIA REPAIR  1997? 1998?   Rt side   SPINE SURGERY  09/2001   Neck surgery C4/C5   No Known Allergies    Objective:    Physical Exam Vitals and nursing note reviewed.  Constitutional:      General: He is not in acute distress.    Appearance: Normal appearance.  HENT:     Head: Normocephalic.  Cardiovascular:     Rate and Rhythm: Normal rate and regular rhythm.  Pulmonary:     Effort: Pulmonary effort is normal.     Breath sounds: Normal breath sounds.  Musculoskeletal:        General: Normal range of motion.     Cervical back: Normal range of motion.  Skin:    General: Skin is warm and dry.  Neurological:     Mental Status: He is alert and oriented to person, place, and time.  Psychiatric:        Mood and Affect: Mood normal.    BP (!) 156/90   Pulse 73   Temp 98.3 F (36.8 C) (Temporal)   Ht 5' 10 (1.778 m)   Wt 196 lb 4 oz (89 kg)   SpO2 97%   BMI 28.16 kg/m  Wt Readings from Last 3 Encounters:  04/28/24 196 lb 4 oz (89 kg)  03/27/24 195 lb 6 oz (88.6 kg)  10/11/23 185 lb (83.9 kg)      Lucius Krabbe, NP

## 2024-05-03 ENCOUNTER — Other Ambulatory Visit: Payer: Self-pay | Admitting: Family

## 2024-05-03 DIAGNOSIS — E782 Mixed hyperlipidemia: Secondary | ICD-10-CM

## 2024-05-05 ENCOUNTER — Other Ambulatory Visit: Payer: Self-pay | Admitting: Family

## 2024-05-05 DIAGNOSIS — E782 Mixed hyperlipidemia: Secondary | ICD-10-CM

## 2024-05-26 ENCOUNTER — Other Ambulatory Visit: Payer: Self-pay | Admitting: Family

## 2024-05-26 DIAGNOSIS — I10 Essential (primary) hypertension: Secondary | ICD-10-CM

## 2024-06-26 ENCOUNTER — Ambulatory Visit
Admission: RE | Admit: 2024-06-26 | Discharge: 2024-06-26 | Disposition: A | Discharge status: Home / Self Care | Source: Ambulatory Visit

## 2024-06-26 VITALS — BP 144/83 | HR 66 | Temp 98.7°F | Resp 16 | Wt 198.4 lb

## 2024-06-26 DIAGNOSIS — J069 Acute upper respiratory infection, unspecified: Secondary | ICD-10-CM

## 2024-06-26 MED ORDER — PROMETHAZINE-DM 6.25-15 MG/5ML PO SYRP: 5.0000 mL | ORAL_SOLUTION | Freq: Four times a day (QID) | ORAL | 0 refills | Status: DC | PRN

## 2024-06-26 MED ORDER — IPRATROPIUM BROMIDE 0.06 % NA SOLN: 2.0000 | Freq: Four times a day (QID) | NASAL | 12 refills | Status: AC

## 2024-06-26 NOTE — ED Triage Notes (Signed)
Pt c/o cough, congestion x 2 weeks

## 2024-06-26 NOTE — ED Provider Notes (Signed)
 [facility]    CSN: [id number] Arrival date & time: 06/26/24  1017      History   Chief Complaint Chief Complaint  Patient presents with   Cough    Chest congestion but no flu symptoms - Entered by patient    HPI Karl Davis is a 63 y.o. male.   HPI  63 year old male with past medical history significant for GERD, dysphagia, arthritis presents for evaluation of 2 weeks worth of cough and congestion.  He reports that his cough is intermittently productive for clear sputum and he has had some shortness breath and wheezing.  He also Dors is some mild runny nose and nasal congestion.  No fever.  Past Medical History:  Diagnosis Date   Arthritis    Dysphagia 02/01/2022   GERD (gastroesophageal reflux disease)    Numbness and tingling in right hand 08/29/2021   Substance abuse (HCC) 1981    Patient Active Problem List   Diagnosis Date Noted   Gastroesophageal reflux disease 03/27/2024   Essential (primary) hypertension 03/27/2024   Mixed hyperlipidemia 03/27/2024   Cellulitis of left lower extremity 10/11/2023   Toenail fungus 10/11/2023   Elevated blood pressure reading 10/11/2023   Family history of prostate cancer 09/10/2023   Memory difficulties 08/15/2022   L4 vertebral fracture (HCC) 02/01/2022   Bilateral carpal tunnel syndrome 10/05/2021   Other spondylosis with radiculopathy, cervical region 10/05/2021   Attention deficit hyperactivity disorder (ADHD), 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 1998?   Rt side   SPINE SURGERY  09/2001   Neck surgery C4/C5       Home Medications    Prior to Admission medications  Medication Sig  Start Date End Date Taking? Authorizing Provider  acetaminophen  (TYLENOL ) 500 MG tablet Take 2 tablets (1,000 mg total) by mouth every 6 (six) hours. 01/26/22  Yes SimaanAlmarie RAMAN, PA-C  amoxicillin-clavulanate (AUGMENTIN) 875-125 MG tablet Take 1 tablet by mouth every 12 (twelve) hours for 7 days. 06/26/24 07/03/24 Yes Bernardino Ditch, NP  aspirin  EC 81 MG tablet Take 1 tablet (81 mg total) by mouth daily. Swallow whole. 01/26/22  Yes Simaan, Almarie RAMAN, PA-C  benzonatate (TESSALON) 100 MG capsule Take 2 capsules (200 mg total) by mouth every 8 (eight) hours. 06/26/24  Yes Bernardino Ditch, NP  buPROPion  (WELLBUTRIN  SR) 150 MG 12 hr tablet TAKE 1 TABLET BY MOUTH DAILY FOR 3 DAYS THEN TAKE 1 TABLET BY MOUTH TWICE DAILY 03/27/24  Yes Lucius Krabbe, NP  famotidine (PEPCID) 20 MG tablet Take 20 mg by mouth daily as needed for heartburn. 01/22/08  Yes [provider]  ibuprofen (ADVIL) 200 MG tablet Take 600 mg by mouth 2 (two) times daily as needed for mild pain. 01/22/08  Yes [provider]  ipratropium (ATROVENT ) 0.06 % nasal spray Place 2 sprays into both nostrils 4 (four) times daily. 06/26/24  Yes Bernardino Ditch, NP  lisinopril  (ZESTRIL ) 10 MG tablet TAKE 1 TABLET(10 MG) BY MOUTH DAILY 05/26/24  Yes Hudnell, Krabbe, NP  pantoprazole  (PROTONIX ) 40 MG tablet Take 1 tablet (40 mg total) by mouth daily. 03/27/24  Yes Lucius Krabbe, NP  promethazine-dextromethorphan (PROMETHAZINE-DM) 6.25-15 MG/5ML syrup  Take 5 mLs by mouth 4 (four) times daily as needed. 06/26/24  Yes Bernardino Ditch, NP  rosuvastatin  (CRESTOR ) 5 MG tablet Take 1 tablet (5 mg total) by mouth every Monday, Wednesday, and Friday. 04/28/24  Yes Lucius Krabbe, NP  tadalafil  (CIALIS ) 5 MG tablet Take 1 tablet (5 mg total) by mouth daily. 12/06/23  Yes Lucius Krabbe, NP  Tetrahydrozoline HCl (VISINE OP) Place 1 drop into both eyes 2 (two) times daily as needed (dry eyes).   Yes [provider]    Family  History Family History  Problem Relation Age of Onset   Depression Mother    Diabetes Mother    Prostate cancer Father    Depression Sister    Depression Sister    ADD / ADHD Son     Social History Social History[1]   Allergies   Patient has no known allergies.   Review of Systems Review of Systems  Constitutional:  Negative for fever.  HENT:  Positive for congestion and rhinorrhea. Negative for ear pain.   Respiratory:  Positive for cough, shortness of breath and wheezing.      Physical Exam Triage Vital Signs ED Triage Vitals  Encounter Vitals Group     BP 06/26/24 1039 (!) 144/83     Girls Systolic BP Percentile --      Girls Diastolic BP Percentile --      Boys Systolic BP Percentile --      Boys Diastolic BP Percentile --      Pulse Rate 06/26/24 1039 66     Resp 06/26/24 1039 16     Temp 06/26/24 1039 98.7 F (37.1 C)     Temp Source 06/26/24 1039 Oral     SpO2 06/26/24 1039 99 %     Weight 06/26/24 1038 198 lb 6.4 oz (90 kg)     Height --      Head Circumference --      Peak Flow --      Pain Score 06/26/24 1038 0     Pain Loc --      Pain Education --      Exclude from Growth Chart --    No data found.  Updated Vital Signs BP (!) 144/83 (BP Location: Left Arm)   Pulse 66   Temp 98.7 F (37.1 C) (Oral)   Resp 16   Wt 198 lb 6.4 oz (90 kg)   SpO2 99%   BMI 28.47 kg/m   Visual Acuity Right Eye Distance:   Left Eye Distance:   Bilateral Distance:    Right Eye Near:   Left Eye Near:    Bilateral Near:     Physical Exam Vitals and nursing note reviewed.  Constitutional:      Appearance: Normal appearance. He is not ill-appearing.  HENT:     Head: Normocephalic and atraumatic.     Nose: Congestion and rhinorrhea present.     Comments: His mucosa is erythematous and edematous with scant clear discharge in both nares.    Mouth/Throat:     Mouth: Mucous membranes are moist.     Pharynx: Oropharynx is clear. Posterior oropharyngeal  erythema present. No oropharyngeal exudate.     Comments: Mild erythema to the posterior pharynx with light yellow postnasal drip. Cardiovascular:     Rate and Rhythm: Normal rate and regular rhythm.     Pulses: Normal pulses.     Heart sounds: Normal heart sounds. No murmur heard.    No friction rub.  No gallop.  Pulmonary:     Effort: Pulmonary effort is normal.     Breath sounds: Normal breath sounds. No wheezing, rhonchi or rales.  Musculoskeletal:     Cervical back: Normal range of motion and neck supple. No tenderness.  Lymphadenopathy:     Cervical: No cervical adenopathy.  Skin:    General: Skin is warm and dry.     Capillary Refill: Capillary refill takes less than 2 seconds.     Findings: No erythema or rash.  Neurological:     General: No focal deficit present.     Mental Status: He is alert and oriented to person, place, and time.      UC Treatments / Results  Labs (all labs ordered are listed, but only abnormal results are displayed) Labs Reviewed - No data to display  EKG   Radiology No results found.  Procedures Procedures (including critical care time)  Medications Ordered in UC Medications - No data to display  Initial Impression / Assessment and Plan / UC Course  I have reviewed the triage vital signs and the nursing notes.  Pertinent labs & imaging results that were available during my care of the patient were reviewed by me and considered in my medical decision making (see chart for details).   Patient is a pleasant, nontoxic-appearing 63 year old male presenting for evaluation of 2 weeks worth of respiratory symptoms as outlined in HPI above.  In the exam room, he is not in any acute distress.  He is able to speak in full sentence without dyspnea or tachypnea.  Respiratory rate at triage was 16 with a 99% room air oxygen saturation.  He is afebrile with an oral temp of 98.7.  His lungs are clear to auscultation all fields.  Upper respiratory exam  reveals mild erythema and edema of his nasal mucosa without significant nasal discharge of any kind.  He does have erythema to the posterior oropharynx with some light yellow postnasal drip.  I suspect that his cough is being driven by upper respiratory symptoms.  Given he has had symptoms for 2 weeks I do feel a trial of antibiotics is warranted so I will discharge him home on Augmentin 875 twice daily with food for 7 days.  Also Atrovent  nasal spray for the congestion and Tessalon Perles and Promethazine DM cough syrup for cough and congestion.  Return precautions reviewed.  He denies need for work note.   Final Clinical Impressions(s) / UC Diagnoses   Final diagnoses:  URI with cough and congestion     Discharge Instructions      Take the Augmentin, 875 mg twice daily with food, for 7 days for treatment of your URI.  Use over-the-counter Tylenol  and/or ibuprofen according to package instructions as needed for any fever or pain.  Use the Atrovent  nasal spray, 2 squirts in each nostril every 6 hours, as needed for runny nose and postnasal drip.  Use the Tessalon Perles every 8 hours during the day.  Take them with a small sip of water.  They may give you some numbness to the base of your tongue or a metallic taste in your mouth, this is normal.  Use the Promethazine DM cough syrup at bedtime for cough and congestion.  It will make you drowsy so do not take it during the day.  Return for reevaluation or see your primary care provider for any new or worsening symptoms.      ED Prescriptions     Medication  Sig Dispense Auth. Provider   amoxicillin-clavulanate (AUGMENTIN) 875-125 MG tablet Take 1 tablet by mouth every 12 (twelve) hours for 7 days. 14 tablet Bernardino Ditch, NP   benzonatate (TESSALON) 100 MG capsule Take 2 capsules (200 mg total) by mouth every 8 (eight) hours. 21 capsule Bernardino Ditch, NP   ipratropium (ATROVENT ) 0.06 % nasal spray Place 2 sprays into both nostrils 4  (four) times daily. 15 mL Bernardino Ditch, NP   promethazine-dextromethorphan (PROMETHAZINE-DM) 6.25-15 MG/5ML syrup Take 5 mLs by mouth 4 (four) times daily as needed. 118 mL Bernardino Ditch, NP      PDMP not reviewed this encounter.    [1]  Social History Tobacco Use   Smoking status: Former    Types: Cigars    Passive exposure: Yes   Smokeless tobacco: Former  Building Services Engineer status: Never Used  Substance Use Topics   Alcohol  use: Not Currently   Drug use: Not Currently    Types: Amphetamines, Crack cocaine, Marijuana, LSD     Bernardino Ditch, NP 06/26/24 1056

## 2024-06-26 NOTE — Discharge Instructions (Addendum)
 Take the Augmentin, 875 mg twice daily with food, for 7 days for treatment of your URI.  Use over-the-counter Tylenol  and/or ibuprofen according to package instructions as needed for any fever or pain.  Use the Atrovent  nasal spray, 2 squirts in each nostril every 6 hours, as needed for runny nose and postnasal drip.  Use the Tessalon Perles every 8 hours during the day.  Take them with a small sip of water.  They may give you some numbness to the base of your tongue or a metallic taste in your mouth, this is normal.  Use the Promethazine DM cough syrup at bedtime for cough and congestion.  It will make you drowsy so do not take it during the day.  Return for reevaluation or see your primary care provider for any new or worsening symptoms.

## 2024-07-11 ENCOUNTER — Ambulatory Visit: Admitting: Family

## 2024-07-11 ENCOUNTER — Other Ambulatory Visit: Payer: Self-pay | Admitting: Family

## 2024-07-11 DIAGNOSIS — E782 Mixed hyperlipidemia: Secondary | ICD-10-CM

## 2024-07-17 ENCOUNTER — Encounter: Payer: Self-pay | Admitting: Family

## 2024-07-17 ENCOUNTER — Ambulatory Visit: Admitting: Family

## 2024-07-17 VITALS — BP 150/95 | HR 79 | Temp 98.3°F | Ht 70.0 in | Wt 195.1 lb

## 2024-07-17 DIAGNOSIS — I10 Essential (primary) hypertension: Secondary | ICD-10-CM | POA: Diagnosis not present

## 2024-07-17 DIAGNOSIS — E782 Mixed hyperlipidemia: Secondary | ICD-10-CM | POA: Diagnosis not present

## 2024-07-17 MED ORDER — LISINOPRIL 20 MG PO TABS: 20.0000 mg | ORAL_TABLET | Freq: Every day | ORAL | 5 refills | Status: AC

## 2024-07-17 MED ORDER — ROSUVASTATIN CALCIUM 5 MG PO TABS: 5.0000 mg | ORAL_TABLET | ORAL | 5 refills | Status: AC

## 2024-07-17 NOTE — Progress Notes (Signed)
 "  Patient ID: Karl Davis, male    DOB: 1960/08/14, 64 y.o.   MRN: [id number]  Chief Complaint  Patient presents with   Essential (primary) hypertension    Follow up  Discussed the use of AI scribe software for clinical note transcription with the patient, who gave verbal consent to proceed.  History of Present Illness Karl Davis is a 64 year old male with hypertension who presents for follow-up of his blood pressure management.  He takes lisinopril  in the mornings and tolerates it without dry cough or swelling. He developed a cough around Thanksgiving that he attributes to postnasal drainage. He was seen in urgent care last month for this cough and then developed flu-like symptoms with fever after taking prescribed medication, but flu and COVID tests were negative. He monitors blood pressure at home. Readings had improved to the 130s/80s-70s but have recently increased to the 150s/90s. He has been eating less healthfully and is working on improving diet and hydration. He started rosuvastatin  three days a week in October for high LDL. Triglycerides and HDL have been good. He has neck osteophytes and wonders if they contribute to throat irritation and cough. Tessalon Perles have not reduced cough intensity or frequency. He denies congestion, wheezing, or significant weight change.  Assessment & Plan Essential hypertension Blood pressure increased to 150s/90s from 130s/80s-70s. Recent illness may have contributed. No lisinopril  side effects noted, though patient did mention a cough but does not believe it is related to lisinopril . Weight stable despite dietary indiscretions. - Increased lisinopril  to 20 mg daily. - Continue home blood pressure monitoring. - Encouraged dietary sodium reduction and regular exercise. - F/U in 2 mos w/CPE  Mixed hyperlipidemia On rosuvastatin  three times a week. LDL slightly elevated, HDL good, triglycerides managed. Discussed dietary  impact on triglycerides and benefits of fatty fish or fish oil. - Continue rosuvastatin  5mg  3d/week, refill sent. - Encouraged consumption of fatty fish or fish oil supplements. - F/U in 2 mos for CPE w/fasting labs   Subjective:    Outpatient Medications Prior to Visit  Medication Sig Dispense Refill   acetaminophen  (TYLENOL ) 500 MG tablet Take 2 tablets (1,000 mg total) by mouth every 6 (six) hours. 30 tablet 0   aspirin  EC 81 MG tablet Take 1 tablet (81 mg total) by mouth daily. Swallow whole. 30 tablet 12   benzonatate (TESSALON) 100 MG capsule Take 2 capsules (200 mg total) by mouth every 8 (eight) hours. 21 capsule 0   buPROPion  (WELLBUTRIN  SR) 150 MG 12 hr tablet TAKE 1 TABLET BY MOUTH DAILY FOR 3 DAYS THEN TAKE 1 TABLET BY MOUTH TWICE DAILY 180 tablet 1   famotidine (PEPCID) 20 MG tablet Take 20 mg by mouth daily as needed for heartburn.     ibuprofen (ADVIL) 200 MG tablet Take 600 mg by mouth 2 (two) times daily as needed for mild pain.     ipratropium (ATROVENT ) 0.06 % nasal spray Place 2 sprays into both nostrils 4 (four) times daily. 15 mL 12   pantoprazole  (PROTONIX ) 40 MG tablet Take 1 tablet (40 mg total) by mouth daily. 90 tablet 1   tadalafil  (CIALIS ) 5 MG tablet Take 1 tablet (5 mg total) by mouth daily. 90 tablet 1   Tetrahydrozoline HCl (VISINE OP) Place 1 drop into both eyes 2 (two) times daily as needed (dry eyes).     lisinopril  (ZESTRIL ) 10 MG tablet TAKE 1 TABLET(10 MG) BY MOUTH DAILY 30 tablet 1  promethazine -dextromethorphan (PROMETHAZINE -DM) 6.25-15 MG/5ML syrup Take 5 mLs by mouth 4 (four) times daily as needed. 118 mL 0   rosuvastatin  (CRESTOR ) 5 MG tablet TAKE 1 TABLET BY MOUTH EVERY MONDAY, WEDNESDAY, AND FRIDAY 15 tablet 1   No facility-administered medications prior to visit.   Past Medical History:  Diagnosis Date   Arthritis    Dysphagia 02/01/2022   GERD (gastroesophageal reflux disease)    Numbness and tingling in right hand 08/29/2021    Substance abuse (HCC) 1981   Past Surgical History:  Procedure Laterality Date   carpal right hand Right 10/17/2021   FRACTURE SURGERY  02/1981   4th metacarpal Left hand   HERNIA REPAIR  1997? 1998?   Rt side   SPINE SURGERY  09/2001   Neck surgery C4/C5   Allergies[1]    Objective:    Physical Exam Vitals and nursing note reviewed.  Constitutional:      General: He is not in acute distress.    Appearance: Normal appearance.  HENT:     Head: Normocephalic.  Cardiovascular:     Rate and Rhythm: Normal rate and regular rhythm.  Pulmonary:     Effort: Pulmonary effort is normal.     Breath sounds: Normal breath sounds.  Musculoskeletal:        General: Normal range of motion.     Cervical back: Normal range of motion.  Skin:    General: Skin is warm and dry.  Neurological:     Mental Status: He is alert and oriented to person, place, and time.  Psychiatric:        Mood and Affect: Mood normal.    BP (!) 150/95 (BP Location: Left Arm, Patient Position: Sitting, Cuff Size: Large)   Pulse 79   Temp 98.3 F (36.8 C) (Temporal)   Ht 5' 10 (1.778 m)   Wt 195 lb 2 oz (88.5 kg)   SpO2 97%   BMI 28.00 kg/m  Wt Readings from Last 3 Encounters:  07/17/24 195 lb 2 oz (88.5 kg)  06/26/24 198 lb 6.4 oz (90 kg)  04/28/24 196 lb 4 oz (89 kg)      Karl Macdonnell, NP     [1] No Known Allergies  "

## 2024-07-18 ENCOUNTER — Other Ambulatory Visit: Payer: Self-pay | Admitting: Family

## 2024-07-18 DIAGNOSIS — N521 Erectile dysfunction due to diseases classified elsewhere: Secondary | ICD-10-CM

## 2024-08-21 ENCOUNTER — Ambulatory Visit: Admitting: Orthopedic Surgery
